# Patient Record
Sex: Female | Born: 1965 | Race: White | Hispanic: No | Smoking: Never smoker
Health system: Southern US, Community
[De-identification: ages and names within clinical notes are randomized; demographics above are authoritative.]

## PROBLEM LIST (undated history)

## (undated) DIAGNOSIS — N6001 Solitary cyst of right breast: Secondary | ICD-10-CM

## (undated) DIAGNOSIS — N6002 Solitary cyst of left breast: Secondary | ICD-10-CM

## (undated) DIAGNOSIS — J339 Nasal polyp, unspecified: Secondary | ICD-10-CM

## (undated) DIAGNOSIS — K2970 Gastritis, unspecified, without bleeding: Secondary | ICD-10-CM

## (undated) DIAGNOSIS — E669 Obesity, unspecified: Secondary | ICD-10-CM

## (undated) DIAGNOSIS — K219 Gastro-esophageal reflux disease without esophagitis: Secondary | ICD-10-CM

## (undated) DIAGNOSIS — A549 Gonococcal infection, unspecified: Secondary | ICD-10-CM

## (undated) DIAGNOSIS — A048 Other specified bacterial intestinal infections: Secondary | ICD-10-CM

## (undated) DIAGNOSIS — J45909 Unspecified asthma, uncomplicated: Secondary | ICD-10-CM

## (undated) DIAGNOSIS — D649 Anemia, unspecified: Secondary | ICD-10-CM

## (undated) DIAGNOSIS — J3489 Other specified disorders of nose and nasal sinuses: Secondary | ICD-10-CM

## (undated) DIAGNOSIS — R519 Headache, unspecified: Secondary | ICD-10-CM

## (undated) DIAGNOSIS — Z8742 Personal history of other diseases of the female genital tract: Secondary | ICD-10-CM

## (undated) DIAGNOSIS — M1990 Unspecified osteoarthritis, unspecified site: Secondary | ICD-10-CM

## (undated) DIAGNOSIS — F419 Anxiety disorder, unspecified: Secondary | ICD-10-CM

## (undated) DIAGNOSIS — T7840XA Allergy, unspecified, initial encounter: Secondary | ICD-10-CM

## (undated) DIAGNOSIS — I10 Essential (primary) hypertension: Secondary | ICD-10-CM

## (undated) DIAGNOSIS — A749 Chlamydial infection, unspecified: Secondary | ICD-10-CM

## (undated) DIAGNOSIS — R51 Headache: Secondary | ICD-10-CM

## (undated) DIAGNOSIS — R32 Unspecified urinary incontinence: Secondary | ICD-10-CM

## (undated) DIAGNOSIS — Z8041 Family history of malignant neoplasm of ovary: Secondary | ICD-10-CM

## (undated) HISTORY — DX: Family history of malignant neoplasm of ovary: Z80.41

## (undated) HISTORY — DX: Personal history of other diseases of the female genital tract: Z87.42

## (undated) HISTORY — DX: Essential (primary) hypertension: I10

## (undated) HISTORY — DX: Anemia, unspecified: D64.9

## (undated) HISTORY — DX: Gastritis, unspecified, without bleeding: K29.70

## (undated) HISTORY — DX: Nasal polyp, unspecified: J33.9

## (undated) HISTORY — PX: NASAL POLYP SURGERY: SHX186

## (undated) HISTORY — PX: TONSILLECTOMY: SUR1361

## (undated) HISTORY — DX: Unspecified urinary incontinence: R32

## (undated) HISTORY — DX: Allergy, unspecified, initial encounter: T78.40XA

## (undated) HISTORY — DX: Obesity, unspecified: E66.9

## (undated) HISTORY — DX: Solitary cyst of left breast: N60.02

## (undated) HISTORY — DX: Solitary cyst of left breast: N60.01

## (undated) HISTORY — DX: Other specified disorders of nose and nasal sinuses: J34.89

---

## 1998-11-12 HISTORY — PX: TUBAL LIGATION: SHX77

## 2006-08-27 ENCOUNTER — Ambulatory Visit: Payer: Self-pay

## 2006-08-29 ENCOUNTER — Ambulatory Visit: Payer: Self-pay

## 2006-11-12 HISTORY — PX: CHOLECYSTECTOMY: SHX55

## 2007-03-03 ENCOUNTER — Ambulatory Visit: Payer: Self-pay

## 2007-08-01 ENCOUNTER — Ambulatory Visit: Payer: Self-pay | Admitting: Gastroenterology

## 2007-09-01 ENCOUNTER — Ambulatory Visit: Payer: Self-pay

## 2007-09-01 ENCOUNTER — Ambulatory Visit: Payer: Self-pay | Admitting: Surgery

## 2007-09-01 ENCOUNTER — Other Ambulatory Visit: Payer: Self-pay

## 2009-02-07 ENCOUNTER — Ambulatory Visit: Payer: Self-pay | Admitting: Family Medicine

## 2009-09-09 ENCOUNTER — Ambulatory Visit: Payer: Self-pay

## 2009-12-23 ENCOUNTER — Ambulatory Visit: Payer: Self-pay | Admitting: Surgery

## 2009-12-28 ENCOUNTER — Ambulatory Visit: Payer: Self-pay | Admitting: Surgery

## 2010-10-16 ENCOUNTER — Ambulatory Visit: Payer: Self-pay

## 2010-11-12 HISTORY — PX: HERNIA REPAIR: SHX51

## 2010-12-25 HISTORY — PX: ENDOMETRIAL BIOPSY: SHX622

## 2011-11-13 DIAGNOSIS — Z8742 Personal history of other diseases of the female genital tract: Secondary | ICD-10-CM

## 2011-11-13 HISTORY — DX: Personal history of other diseases of the female genital tract: Z87.42

## 2011-12-31 ENCOUNTER — Ambulatory Visit: Payer: Self-pay

## 2012-01-03 ENCOUNTER — Ambulatory Visit: Payer: Self-pay

## 2013-01-21 ENCOUNTER — Ambulatory Visit: Payer: Self-pay

## 2013-01-29 DIAGNOSIS — R339 Retention of urine, unspecified: Secondary | ICD-10-CM | POA: Insufficient documentation

## 2013-01-29 DIAGNOSIS — R399 Unspecified symptoms and signs involving the genitourinary system: Secondary | ICD-10-CM | POA: Insufficient documentation

## 2013-01-29 DIAGNOSIS — N23 Unspecified renal colic: Secondary | ICD-10-CM | POA: Insufficient documentation

## 2013-01-29 DIAGNOSIS — N393 Stress incontinence (female) (male): Secondary | ICD-10-CM | POA: Insufficient documentation

## 2013-02-16 ENCOUNTER — Ambulatory Visit (INDEPENDENT_AMBULATORY_CARE_PROVIDER_SITE_OTHER): Payer: BC Managed Care – PPO | Admitting: General Surgery

## 2013-02-16 ENCOUNTER — Other Ambulatory Visit: Payer: Self-pay

## 2013-02-16 ENCOUNTER — Encounter: Payer: Self-pay | Admitting: General Surgery

## 2013-02-16 VITALS — BP 120/82 | HR 76 | Resp 14 | Ht 64.0 in | Wt 230.0 lb

## 2013-02-16 DIAGNOSIS — N63 Unspecified lump in unspecified breast: Secondary | ICD-10-CM

## 2013-02-16 DIAGNOSIS — N6001 Solitary cyst of right breast: Secondary | ICD-10-CM

## 2013-02-16 DIAGNOSIS — N6009 Solitary cyst of unspecified breast: Secondary | ICD-10-CM

## 2013-02-16 NOTE — Patient Instructions (Signed)
Six weeks

## 2013-02-16 NOTE — Progress Notes (Addendum)
Patient ID: Anita Clark, female   DOB: [birth year], 48 y.o.   MRN: [id number]  Chief Complaint  Patient presents with  . Mass    right breast    HPI Anita Clark is a 47 y.o. female who presents for a follow up mammogram 01/21/13 cat 3 . Patient has never had any breast problems in the past. Patient states she feels no lumps.  HPI  Past Medical History  Diagnosis Date  . Hypertension   . Anemia   . Allergy     Past Surgical History  Procedure Laterality Date  . Tubal ligation  2000  . Hernia repair  2012  . Cholecystectomy  2008    Family History  Problem Relation Age of Onset  . Hypertension Father   . Heart disease Father   . Heart disease Sister   . Hypertension Brother   . Hypertension Brother   . Hypertension Brother   . Ovarian cancer Maternal Grandmother     Social History History  Substance Use Topics  . Smoking status: Never Smoker   . Smokeless tobacco: Never Used  . Alcohol Use: No    No Known Allergies  Current Outpatient Prescriptions  Medication Sig Dispense Refill  . ALBUTEROL SULFATE HFA IN Inhale 2 puffs into the lungs as needed.      Marland Kitchen amLODipine (NORVASC) 10 MG tablet Take 10 mg by mouth daily.      Marland Kitchen atenolol (TENORMIN) 25 MG tablet Take 25 mg by mouth daily.      Marland Kitchen estradiol (ESTRACE) 1 MG tablet Take 1 mg by mouth daily.      Marland Kitchen imipramine (TOFRANIL) 25 MG tablet Take 25 tablets by mouth 2 (two) times daily with a meal.      . loratadine (ALLERGY) 10 MG tablet Take 10 mg by mouth daily.      . Omega-3 Fatty Acids (FISH OIL PO) Take 1 capsule by mouth daily.      . progesterone (PROMETRIUM) 200 MG capsule Take 200 mg by mouth daily.       No current facility-administered medications for this visit.    Review of Systems Review of Systems  Constitutional: Negative.   Respiratory: Negative.   Cardiovascular: Negative.     Blood pressure 120/82, pulse 76, resp. rate 14, height 5\' 4"  (1.626 m), weight 230 lb (104.327 kg), last  menstrual period 01/22/2013.  Physical Exam Physical Exam  Constitutional: She appears well-developed and well-nourished.  Neck: Normal range of motion. Neck supple.  Cardiovascular: Normal rate, regular rhythm and normal heart sounds.   Pulmonary/Chest: Effort normal and breath sounds normal. Right breast exhibits mass. Right breast exhibits no inverted nipple, no nipple discharge (1 o'clock 2cm nodular), no skin change and no tenderness. Left breast exhibits no inverted nipple, no mass, no nipple discharge, no skin change and no tenderness.    Lymphadenopathy:       Right axillary: No pectoral and no lateral adenopathy present.       Left axillary: No pectoral and no lateral adenopathy present.   Data Reviewed Bilateral mammograms dated January 09, 2013 reported heterogeneously dense breasts. Lobulated tissue in the right real area of the right breast was reported more prominent on previous studies. Additional views requested. BI-RAD-0.  Focal spot compression views did January 21, 2013 showed persistent density. Ultrasound showed a likely benign cyst but it did not meet all criteria and a 6 month followup was recommended. BI-RAD-3.  Ultrasound examination of the right breast  was completed. The dominant mass at the 1:00 position is a bilobed lesion measuring 1.34 x 1.41 x 1.55 cm. At the 6:00 position a 1.16 x 1.62 x 1.75 cm cyst is identified. At the 11:00 position a small hypoechoic likely cystic lesion measuring 0.49 x 0.67 x 0.68 cm is identified.  The patient was amenable to aspiration. 1 cc of 1% Xylocaine was utilized. The dominant lesions of the one 6:00 position were aspirated with near complete resolution.  Assessment    Multiple breast cysts.     Plan    The patient has been making use of hormonal therapy to control menorrhagia.  Discontinuation is not mandatory.  The patient will be asked to return in 6 weeks to reassess for early cyst recurrence.        [name] 02/17/2013, 9:18 PM

## 2013-02-17 ENCOUNTER — Encounter: Payer: Self-pay | Admitting: General Surgery

## 2013-02-17 DIAGNOSIS — N6009 Solitary cyst of unspecified breast: Secondary | ICD-10-CM | POA: Insufficient documentation

## 2013-03-30 ENCOUNTER — Ambulatory Visit: Payer: BC Managed Care – PPO | Admitting: General Surgery

## 2013-04-29 ENCOUNTER — Ambulatory Visit: Payer: Self-pay | Admitting: General Surgery

## 2013-05-27 ENCOUNTER — Ambulatory Visit (INDEPENDENT_AMBULATORY_CARE_PROVIDER_SITE_OTHER): Payer: BC Managed Care – PPO | Admitting: General Surgery

## 2013-05-27 ENCOUNTER — Encounter: Payer: Self-pay | Admitting: General Surgery

## 2013-05-27 ENCOUNTER — Other Ambulatory Visit: Payer: Self-pay

## 2013-05-27 VITALS — BP 140/84 | HR 92 | Resp 14 | Ht 64.0 in | Wt 229.0 lb

## 2013-05-27 DIAGNOSIS — N6009 Solitary cyst of unspecified breast: Secondary | ICD-10-CM

## 2013-05-27 DIAGNOSIS — N6001 Solitary cyst of right breast: Secondary | ICD-10-CM

## 2013-05-27 NOTE — Patient Instructions (Addendum)
Patient to return in Octl 2014 right breast mammogram.

## 2013-05-27 NOTE — Progress Notes (Signed)
Patient ID: Anita Clark, female   DOB: [birth year], 47 y.o.   MRN: [id number]  Chief Complaint  Patient presents with  . Follow-up    breast cyst    HPI Anita Clark is a 47 y.o. female here today following up from her 6 week right breast cyst. Patient thought she might have appreciated a knot in the right breast as of three days ago.  The patient is experiencing no breast pain or discomfort. She is accompanied today by her daughter, who is a CM A. In the pediatric ICU at [facility]. HPI  Past Medical History  Diagnosis Date  . Hypertension   . Anemia   . Allergy     Past Surgical History  Procedure Laterality Date  . Tubal ligation  2000  . Hernia repair  2012  . Cholecystectomy  2008    Family History  Problem Relation Age of Onset  . Hypertension Father   . Heart disease Father   . Heart disease Sister   . Hypertension Brother   . Hypertension Brother   . Hypertension Brother   . Ovarian cancer Maternal Grandmother     Social History History  Substance Use Topics  . Smoking status: Never Smoker   . Smokeless tobacco: Never Used  . Alcohol Use: No    No Known Allergies  Current Outpatient Prescriptions  Medication Sig Dispense Refill  . ALBUTEROL SULFATE HFA IN Inhale 2 puffs into the lungs as needed.      Marland Kitchen amLODipine (NORVASC) 10 MG tablet Take 10 mg by mouth daily.      Marland Kitchen atenolol (TENORMIN) 25 MG tablet Take 25 mg by mouth daily.      Marland Kitchen estradiol (ESTRACE) 1 MG tablet Take 1 mg by mouth daily.      Marland Kitchen imipramine (TOFRANIL) 25 MG tablet Take 25 tablets by mouth 2 (two) times daily with a meal.      . loratadine (ALLERGY) 10 MG tablet Take 10 mg by mouth daily.      . Omega-3 Fatty Acids (FISH OIL PO) Take 1 capsule by mouth daily.      . progesterone (PROMETRIUM) 200 MG capsule Take 200 mg by mouth daily.       No current facility-administered medications for this visit.    Review of Systems Review of Systems  Constitutional: Negative.    Respiratory: Negative.   Cardiovascular: Negative.     Blood pressure 140/84, pulse 92, resp. rate 14, height 5\' 4"  (1.626 m), weight 229 lb (103.874 kg).  Physical Exam Physical Exam  Constitutional: She is oriented to person, place, and time. She appears well-developed and well-nourished.  Cardiovascular: Normal rate, regular rhythm and normal heart sounds.   Pulmonary/Chest: Breath sounds normal. Right breast exhibits no inverted nipple, no mass, no nipple discharge, no skin change and no tenderness. Left breast exhibits no inverted nipple, no mass, no nipple discharge, no skin change and no tenderness.  Lymphadenopathy:    She has no cervical adenopathy.    She has no axillary adenopathy.  Neurological: She is alert and oriented to person, place, and time.  Skin: Skin is warm and dry.    Data Reviewed Ultrasound examination of the right breast and 1:00 positions does not show recurrence of the previously aspirated cyst. At the 12:00 position 3 cm from the nipple a poorly defined 0.54 x 0.58 x 0.58 hypoechoic area with a central area of hyper echoic tissue with acoustic shadowing on one view was  appreciated. Multiple cysts are appreciated the retroareolar area. The previously aspirated cyst at the 6:00 position 3 cm level shows partial recurrence with a maximum diameter of 0.5 cm.  Assessment    Multiple breast cysts.  Ongoing HRT for vasomotor symptoms.     Plan    We will followup her breast with a right diagnostic mammogram in October as originally recommended by the radiologist. Anita Clark repeat the ultrasound at that time to reassess the hypoechoic area in the 12:00 position. This may be a degenerating cyst, but it is indeterminate lesion.  The patient should plan on discussing with her GYN provider the role of ongoing HRT therapy. She's been using it for 2 years now, and at maybe time per trial off medication to see if his decreases cyst formation without a return of vasomotor  symptoms.        Anita Clark 05/27/2013, 8:30 PM

## 2013-08-13 ENCOUNTER — Ambulatory Visit: Payer: Self-pay | Admitting: General Surgery

## 2013-08-13 ENCOUNTER — Encounter: Payer: Self-pay | Admitting: General Surgery

## 2013-09-02 ENCOUNTER — Ambulatory Visit (INDEPENDENT_AMBULATORY_CARE_PROVIDER_SITE_OTHER): Payer: BC Managed Care – PPO | Admitting: General Surgery

## 2013-09-02 ENCOUNTER — Encounter: Payer: Self-pay | Admitting: General Surgery

## 2013-09-02 ENCOUNTER — Other Ambulatory Visit: Payer: BC Managed Care – PPO

## 2013-09-02 VITALS — BP 120/78 | HR 80 | Resp 14 | Ht 64.0 in | Wt 229.0 lb

## 2013-09-02 DIAGNOSIS — N63 Unspecified lump in unspecified breast: Secondary | ICD-10-CM

## 2013-09-02 NOTE — Progress Notes (Signed)
Patient ID: Anita Clark, female   DOB: Nov 01, 1966, 47 y.o.   MRN: [id number]  Chief Complaint  Patient presents with  . Follow-up    mammogram    HPI Anita Clark is a 47 y.o. female who presents for a breast evaluation. The most recent right breast  mammogram was done on 08/13/13 at [facility]. Patient does perform regular self breast checks and gets regular mammograms done. The patient states no new problems with her breasts at this time.   HPI  Past Medical History  Diagnosis Date  . Hypertension   . Anemia   . Allergy     Past Surgical History  Procedure Laterality Date  . Tubal ligation  2000  . Hernia repair  2012  . Cholecystectomy  2008    Family History  Problem Relation Age of Onset  . Hypertension Father   . Heart disease Father   . Heart disease Sister   . Hypertension Brother   . Hypertension Brother   . Hypertension Brother   . Ovarian cancer Maternal Grandmother     Social History History  Substance Use Topics  . Smoking status: Never Smoker   . Smokeless tobacco: Never Used  . Alcohol Use: No    No Known Allergies  Current Outpatient Prescriptions  Medication Sig Dispense Refill  . ALBUTEROL SULFATE HFA IN Inhale 2 puffs into the lungs as needed.      Marland Kitchen amLODipine (NORVASC) 10 MG tablet Take 10 mg by mouth daily.      Marland Kitchen atenolol (TENORMIN) 25 MG tablet Take 25 mg by mouth daily.      Marland Kitchen estradiol (ESTRACE) 1 MG tablet Take 1 mg by mouth daily.      Marland Kitchen imipramine (TOFRANIL) 25 MG tablet Take 25 tablets by mouth 2 (two) times daily with a meal.      . loratadine (ALLERGY) 10 MG tablet Take 10 mg by mouth daily.      . Omega-3 Fatty Acids (FISH OIL PO) Take 1 capsule by mouth daily.      . progesterone (PROMETRIUM) 200 MG capsule Take 200 mg by mouth daily.       No current facility-administered medications for this visit.    Review of Systems Review of Systems  Constitutional: Negative.   Respiratory: Negative.   Cardiovascular: Negative.      Blood pressure 120/78, pulse 80, resp. rate 14, height 5\' 4"  (1.626 m), weight 229 lb (103.874 kg), last menstrual period 07/20/2013.  Physical Exam Physical Exam  Constitutional: She is oriented to person, place, and time. She appears well-developed and well-nourished.  Neck: No thyromegaly present.  Cardiovascular: Normal rate and normal heart sounds.   No murmur heard. Pulmonary/Chest: Effort normal and breath sounds normal. Right breast exhibits no inverted nipple, no mass, no nipple discharge, no skin change and no tenderness. Left breast exhibits no inverted nipple, no mass, no nipple discharge, no skin change and no tenderness.  Lymphadenopathy:    She has no cervical adenopathy.    She has no axillary adenopathy.  Neurological: She is alert and oriented to person, place, and time.  Skin: Skin is warm and dry.    Data Reviewed Right breast mammogram dated August 13, 2013 showed circumscribed nodular densities in the upper central medial periareolar right breast decreased in size since previous exam of January 21, 2013. BI-RAD-2.  Ultrasound examination of the right breast in the 12:00 position showed an ill-defined heterogeneous area measuring 0.47 x 0.57 x 0.68  cm. Multiple simple cysts were noted adjacent to this. The patient was amenable to FNA sampling.  Using 1 cc of 1% plain Xylocaine  A 22-gauge needle was used to puncture the area with multiple passes. Decrease in volume noted. Slides x4 repair for cytology.  Assessment    Breast cysts, likely aggravated by ongoing estrogen use.     Plan    The patient is presently asymptomatic. Further followup will take place based on the cytology results obtained today.        [name] 09/02/2013, 9:17 PM

## 2013-09-05 LAB — FINE-NEEDLE ASPIRATION

## 2013-09-08 ENCOUNTER — Telehealth: Payer: Self-pay | Admitting: *Deleted

## 2013-09-08 NOTE — Telephone Encounter (Signed)
Notified patient as instructed, patient pleased. Discussed follow-up appointments, patient agrees. Placed in recalls.  

## 2013-09-08 NOTE — Telephone Encounter (Signed)
Message copied by Currie Paris on Tue Sep 08, 2013  8:25 AM ------      Message from: [name]      Created: Tue Sep 08, 2013  7:49 AM       Notify the patient the cytology was fine. I would like to check her in six months (OV w/ office u/s). Thanks. ------

## 2013-09-17 ENCOUNTER — Other Ambulatory Visit: Payer: Self-pay

## 2013-11-12 HISTORY — PX: BREAST CYST ASPIRATION: SHX578

## 2014-01-20 ENCOUNTER — Ambulatory Visit: Payer: Self-pay

## 2014-03-08 ENCOUNTER — Other Ambulatory Visit: Payer: BC Managed Care – PPO

## 2014-03-08 ENCOUNTER — Encounter: Payer: Self-pay | Admitting: General Surgery

## 2014-03-08 ENCOUNTER — Ambulatory Visit (INDEPENDENT_AMBULATORY_CARE_PROVIDER_SITE_OTHER): Payer: BC Managed Care – PPO | Admitting: General Surgery

## 2014-03-08 VITALS — BP 150/82 | HR 76 | Resp 12 | Ht 64.0 in | Wt 227.0 lb

## 2014-03-08 DIAGNOSIS — N63 Unspecified lump in unspecified breast: Secondary | ICD-10-CM

## 2014-03-08 NOTE — Patient Instructions (Signed)
Continue self breast exams. Call office for any new breast issues or concerns. Patient to return as needed.   

## 2014-03-08 NOTE — Progress Notes (Signed)
Patient ID: Anita Clark, female   DOB: 05/10/1966, 48 y.o.   MRN: [id number]  Chief Complaint  Patient presents with  . Follow-up    ultrasound    HPI Anita Clark is a 48 y.o. female here today for a right breast ultrasound. The patient denies any new problems with her breasts at this time.   HPI  Past Medical History  Diagnosis Date  . Hypertension   . Anemia   . Allergy     Past Surgical History  Procedure Laterality Date  . Tubal ligation  2000  . Hernia repair  2012  . Cholecystectomy  2008    Family History  Problem Relation Age of Onset  . Hypertension Father   . Heart disease Father   . Heart disease Sister   . Hypertension Brother   . Hypertension Brother   . Hypertension Brother   . Ovarian cancer Maternal Grandmother     Social History History  Substance Use Topics  . Smoking status: Never Smoker   . Smokeless tobacco: Never Used  . Alcohol Use: No    No Known Allergies  Current Outpatient Prescriptions  Medication Sig Dispense Refill  . ALBUTEROL SULFATE HFA IN Inhale 2 puffs into the lungs as needed.      Marland Kitchen. amLODipine (NORVASC) 10 MG tablet Take 10 mg by mouth daily.      Marland Kitchen. atenolol (TENORMIN) 25 MG tablet Take 25 mg by mouth daily.      Marland Kitchen. imipramine (TOFRANIL) 25 MG tablet Take 25 tablets by mouth 2 (two) times daily with a meal.      . loratadine (ALLERGY) 10 MG tablet Take 10 mg by mouth daily.      . Omega-3 Fatty Acids (FISH OIL PO) Take 1 capsule by mouth daily.       No current facility-administered medications for this visit.    Review of Systems Review of Systems  Constitutional: Negative.   Respiratory: Negative.   Cardiovascular: Negative.     Blood pressure 150/82, pulse 76, resp. rate 12, height 5\' 4"  (1.626 m), weight 227 lb (102.967 kg), last menstrual period 01/22/2014.  Physical Exam Physical Exam  Constitutional: She is oriented to person, place, and time. She appears well-developed and well-nourished.  Neck:  Neck supple. No thyromegaly present.  Cardiovascular: Normal rate, regular rhythm and normal heart sounds.   No murmur heard. Pulmonary/Chest: Effort normal and breath sounds normal. Right breast exhibits no inverted nipple, no mass, no nipple discharge, no skin change and no tenderness. Left breast exhibits no inverted nipple, no mass, no nipple discharge, no skin change and no tenderness.  Right breast 1/2 cup size bigger than left breast.     Lymphadenopathy:    She has no cervical adenopathy.    She has no axillary adenopathy.  Neurological: She is alert and oriented to person, place, and time.  Skin: Skin is warm and dry.    Data Reviewed Ultrasound examination of the right breast showed previously identified cysts. No solid lesion corresponding to that noted on her last exam. No images, no charge.  Cytology completed September 02, 2013 showed blood and adipose tissue. No epithelial elements. As the area decreased markedly in size this was likely a complex cyst.  Assessment    Benign breast exam.     Plan    The patient should continue annual breast exams and screening mammograms with her primary care provider. Should any clinical changes be identified she is well or  return at any time.      PCP: Harlin Rainhies, David   Judy Goodenow W Korver Graybeal 03/08/2014, 9:37 PM

## 2014-09-13 ENCOUNTER — Encounter: Payer: Self-pay | Admitting: General Surgery

## 2015-01-25 ENCOUNTER — Ambulatory Visit: Payer: Self-pay

## 2015-11-13 DIAGNOSIS — K2970 Gastritis, unspecified, without bleeding: Secondary | ICD-10-CM

## 2015-11-13 HISTORY — PX: ESOPHAGOGASTRODUODENOSCOPY: SHX1529

## 2015-11-13 HISTORY — DX: Gastritis, unspecified, without bleeding: K29.70

## 2015-11-13 HISTORY — PX: COLONOSCOPY: SHX174

## 2015-11-22 ENCOUNTER — Other Ambulatory Visit: Payer: Self-pay | Admitting: Certified Nurse Midwife

## 2015-11-22 DIAGNOSIS — Z1231 Encounter for screening mammogram for malignant neoplasm of breast: Secondary | ICD-10-CM

## 2015-12-07 DIAGNOSIS — N3941 Urge incontinence: Secondary | ICD-10-CM | POA: Insufficient documentation

## 2016-01-13 ENCOUNTER — Encounter: Admission: RE | Payer: Self-pay | Source: Ambulatory Visit

## 2016-01-13 ENCOUNTER — Ambulatory Visit: Admission: RE | Admit: 2016-01-13 | Payer: Self-pay | Source: Ambulatory Visit | Admitting: Gastroenterology

## 2016-01-13 SURGERY — COLONOSCOPY WITH PROPOFOL
Anesthesia: General

## 2016-01-26 ENCOUNTER — Ambulatory Visit
Admission: RE | Admit: 2016-01-26 | Discharge: 2016-01-26 | Disposition: A | Discharge status: Home / Self Care | Payer: BC Managed Care – PPO | Source: Ambulatory Visit | Attending: Certified Nurse Midwife | Admitting: Certified Nurse Midwife

## 2016-01-26 DIAGNOSIS — Z1231 Encounter for screening mammogram for malignant neoplasm of breast: Secondary | ICD-10-CM | POA: Diagnosis present

## 2016-02-23 IMAGING — MG MM DIGITAL SCREENING BILAT W/ CAD
1 series · 5 of 5 positions shown · non-contrast
Comparison: Previous exam(s).

CLINICAL DATA: Screening.

EXAM:
DIGITAL SCREENING BILATERAL MAMMOGRAM WITH CAD

[R CC · right · 5 of 5 slices shown]
[im 1/5]
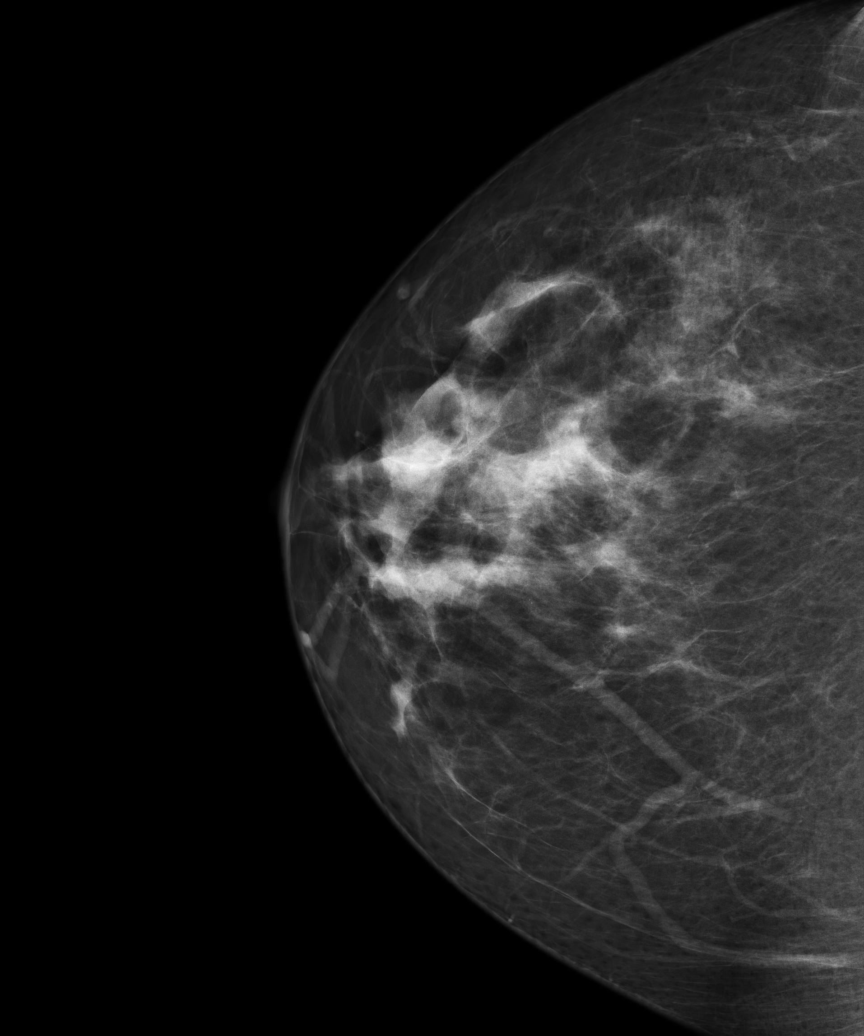
[im 2/5]
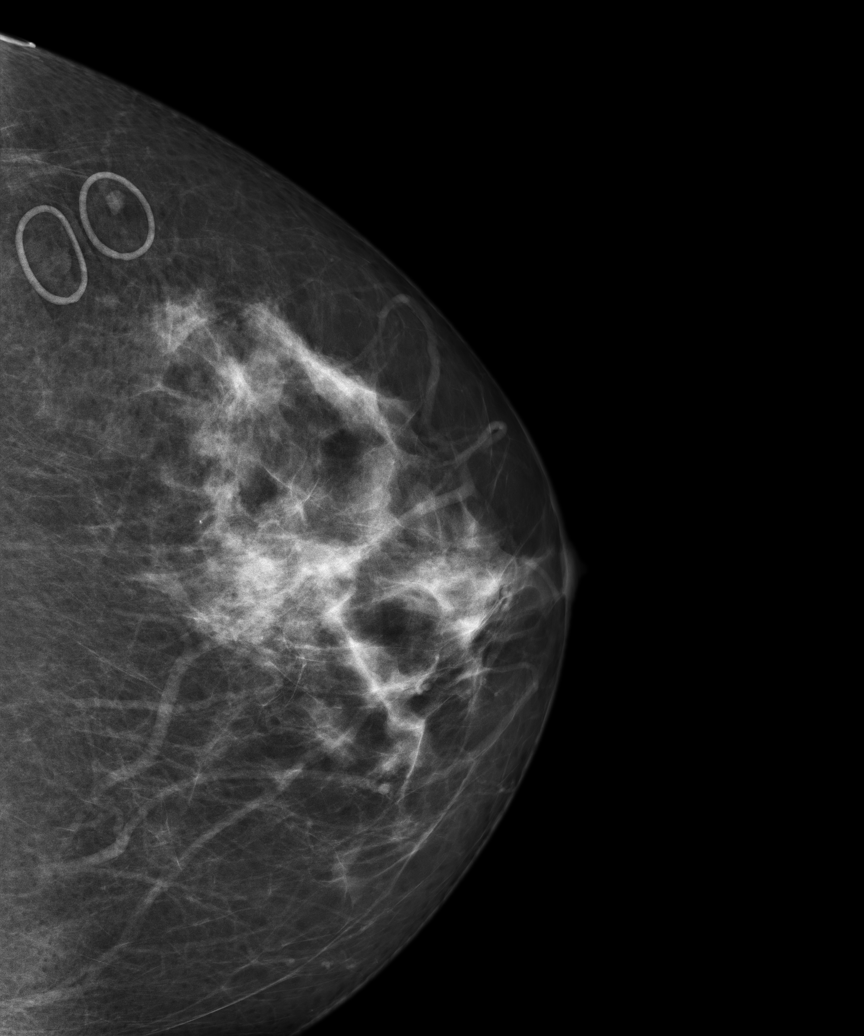
[im 3/5]
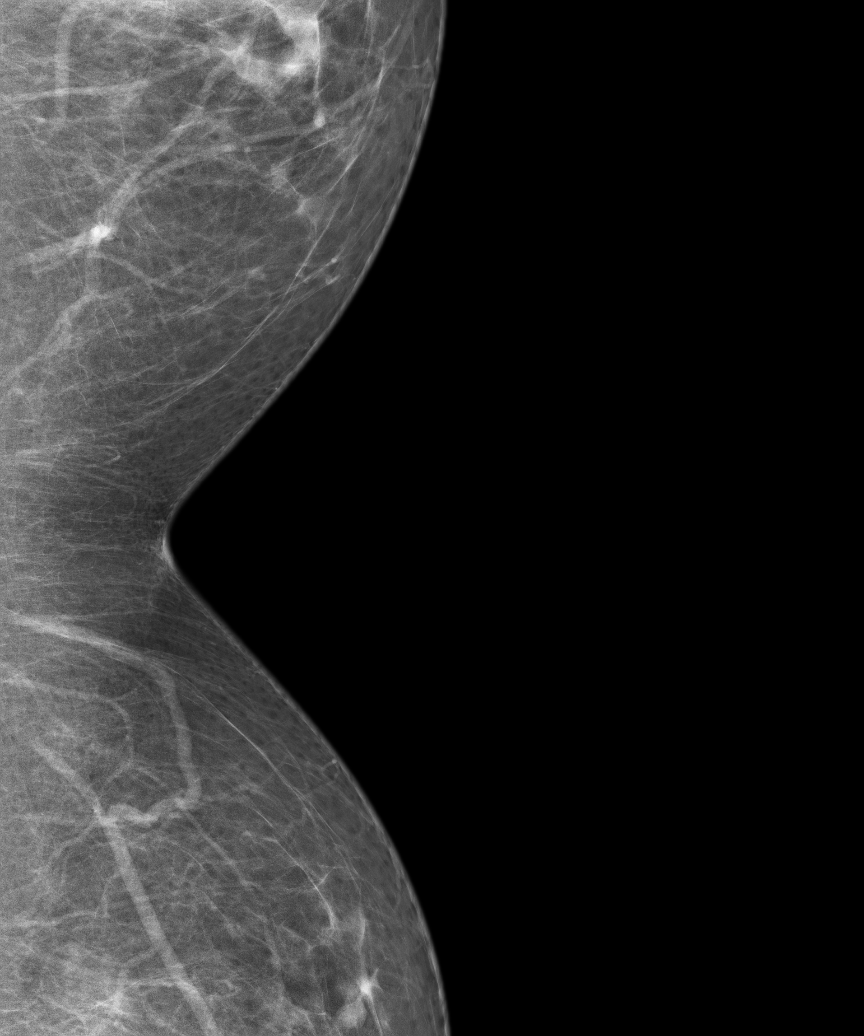
[im 4/5]
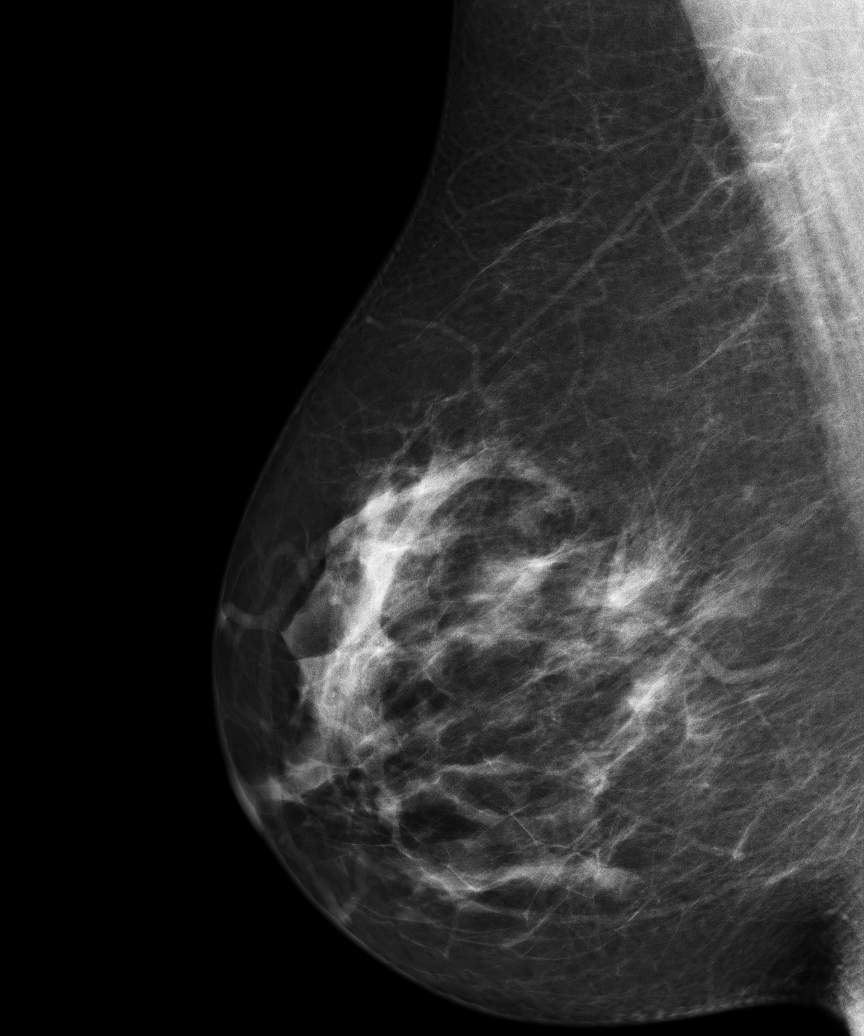
[im 5/5]
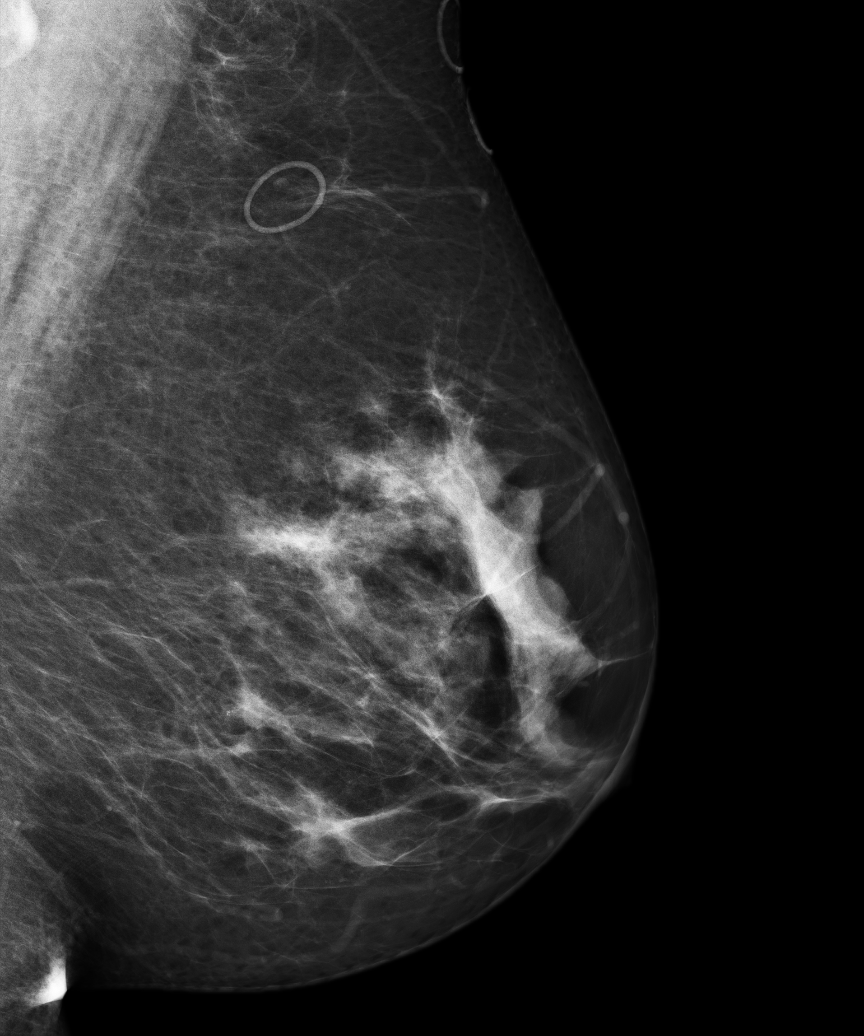

[5 of 5 positions shown; findings below may reference images not displayed]

ACR Breast Density Category c: The breast tissue is heterogeneously
dense, which may obscure small masses.
FINDINGS: There are no findings suspicious for malignancy. Images were
processed with CAD.
IMPRESSION: No mammographic evidence of malignancy. A result letter of this
screening mammogram will be mailed directly to the patient.

RECOMMENDATION:
Screening mammogram in one year. (Code:YJ-2-FEZ)

BI-RADS CATEGORY  1: Negative.

## 2016-06-28 ENCOUNTER — Encounter: Payer: Self-pay | Admitting: General Surgery

## 2016-06-28 ENCOUNTER — Ambulatory Visit (INDEPENDENT_AMBULATORY_CARE_PROVIDER_SITE_OTHER): Payer: BC Managed Care – PPO | Admitting: General Surgery

## 2016-06-28 ENCOUNTER — Other Ambulatory Visit: Payer: Self-pay

## 2016-06-28 VITALS — BP 146/82 | HR 84 | Resp 14 | Ht 64.0 in | Wt 256.0 lb

## 2016-06-28 DIAGNOSIS — N6310 Unspecified lump in the right breast, unspecified quadrant: Secondary | ICD-10-CM

## 2016-06-28 DIAGNOSIS — N63 Unspecified lump in breast: Secondary | ICD-10-CM | POA: Diagnosis not present

## 2016-06-28 DIAGNOSIS — N6001 Solitary cyst of right breast: Secondary | ICD-10-CM | POA: Diagnosis not present

## 2016-06-28 HISTORY — PX: BREAST CYST ASPIRATION: SHX578

## 2016-06-28 NOTE — Patient Instructions (Signed)
The patient is aware to call back for any questions or concerns.  

## 2016-06-28 NOTE — Progress Notes (Addendum)
Patient ID: Anita Clark, female   DOB: 09/06/1966, 50 y.o.   MRN: [id number]  Chief Complaint  Patient presents with  . Breast Problem    knot    HPI Anita Clark is a 50 y.o. female.  Here today for a right breast knot. She noticed this area because the breast was sore. She states she noticed this area about 2-3 weeks ago. It has not changed in size and it just below her nipple. Denies any breast injury or trauma. Last mammogram was March.   HPI  Past Medical History:  Diagnosis Date  . Allergy   . Anemia   . Hypertension     Past Surgical History:  Procedure Laterality Date  . BREAST CYST ASPIRATION Right   . CHOLECYSTECTOMY  2008  . HERNIA REPAIR  2012  . TUBAL LIGATION  2000    Family History  Problem Relation Age of Onset  . Hypertension Father   . Heart disease Father   . Heart disease Sister   . Hypertension Brother   . Hypertension Brother   . Hypertension Brother   . Ovarian cancer Maternal Grandmother     Social History Social History  Substance Use Topics  . Smoking status: Never Smoker  . Smokeless tobacco: Never Used  . Alcohol use No    No Known Allergies  Current Outpatient Prescriptions  Medication Sig Dispense Refill  . ALBUTEROL SULFATE HFA IN Inhale 2 puffs into the lungs as needed.    Marland Kitchen. amLODipine-benazepril (LOTREL) 5-20 MG capsule Take 1 capsule by mouth daily.     . fexofenadine (ALLEGRA) 180 MG tablet Take 180 mg by mouth daily.    Marland Kitchen. imipramine (TOFRANIL) 25 MG tablet Take 25 tablets by mouth 2 (two) times daily with a meal.    . metoprolol succinate (TOPROL-XL) 50 MG 24 hr tablet Take by mouth.     No current facility-administered medications for this visit.     Review of Systems Review of Systems  Constitutional: Negative.   Respiratory: Negative.   Cardiovascular: Negative.     Blood pressure (!) 146/82, pulse 84, resp. rate 14, height 5\' 4"  (1.626 m), weight 256 lb (116.1 kg), last menstrual period  01/22/2014.  Physical Exam Physical Exam  Constitutional: She is oriented to person, place, and time. She appears well-developed and well-nourished.  HENT:  Mouth/Throat: Oropharynx is clear and moist.  Eyes: Conjunctivae are normal. No scleral icterus.  Neck: Neck supple.  Cardiovascular: Normal rate, regular rhythm and normal heart sounds.   Pulmonary/Chest: Effort normal and breath sounds normal. Right breast exhibits mass. Right breast exhibits no inverted nipple, no nipple discharge, no skin change and no tenderness. Left breast exhibits no inverted nipple, no mass, no nipple discharge, no skin change and no tenderness.    Lymphadenopathy:    She has no cervical adenopathy.  Neurological: She is alert and oriented to person, place, and time.  Skin: Skin is warm and dry.  Psychiatric: Her behavior is normal.    Data Reviewed 01/26/2016 mammogram was reviewed. I read-1.  Smoothly marginated nodules more prominent on cc view than last year.  Area of the patient's present concern corresponds to one of the smoothly marginated nodules noted on her March 2017 mammogram.  Ultrasound examination of the right breast was undertaken to assess the palpable mass present on clinical exam.  At the 5:00 position 2 cm from the nipple a thick-walled lesion with strong posterior acoustic enhancement and irregular lumen was identified.  This measured 1.2 x 1.6 x 1.76 cm. The images with the hyperechoic rim suggested an inflamed cyst. BI-RADS-3.  At the 10:00 position, 2 cm from nipple a 1.0 x 1.3 x 1.5 cm simple cyst is identified. Smooth borders, strong posterior acoustic enhancement.  At the 9:00 position multiple cystic lesions were identified measuring up to 0.85 cm in diameter.  While the patient reported significant symptomatic improvement in the discomfort in the index lesion at the 5:00 position she desired aspiration, which I think is warranted based on ultrasound appearance.  1 mL of 1%  plain Xylocaine was utilized and alcohol for skin prep. A 22-gauge needle was used to aspirate the lesion with near complete resolution, although the thickened wall remained. Approximately 1.5 mL of turbid based fluid was obtained, mixed with an equal volume of cytology fixative and sent for cytologic review. The procedure was well tolerated.  Assessment    Likely inflamed cyst involving the right breast parenchyma, resolved on aspiration.    Plan    Follow-up will be determined based on cytology results.      This information has been scribed by Dorathy DaftMarsha Hatch RN, BSN,BC.  Earline MayotteByrnett, Artha Stavros W 06/29/2016, 9:02 PM

## 2016-07-02 ENCOUNTER — Telehealth: Payer: Self-pay | Admitting: General Surgery

## 2016-07-02 LAB — FINE-NEEDLE ASPIRATION

## 2016-07-03 NOTE — Telephone Encounter (Signed)
Cytology benign. We'll arrange for a 6 month follow-up

## 2016-08-07 ENCOUNTER — Ambulatory Visit: Payer: BC Managed Care – PPO | Admitting: General Surgery

## 2016-08-23 ENCOUNTER — Encounter: Payer: Self-pay | Admitting: General Surgery

## 2016-08-23 ENCOUNTER — Ambulatory Visit (INDEPENDENT_AMBULATORY_CARE_PROVIDER_SITE_OTHER): Payer: BC Managed Care – PPO | Admitting: General Surgery

## 2016-08-23 VITALS — BP 148/82 | HR 74 | Resp 12 | Ht 64.0 in | Wt 253.0 lb

## 2016-08-23 DIAGNOSIS — N6001 Solitary cyst of right breast: Secondary | ICD-10-CM

## 2016-08-23 NOTE — Patient Instructions (Signed)
Return as needed

## 2016-08-23 NOTE — Progress Notes (Signed)
Patient ID: Anita Clark, female   DOB: 04/20/1966, 50 y.o.   MRN: [id number]  Chief Complaint  Patient presents with  . Follow-up    breast cyst    HPI Anita Lorylicia Dawn Hitsman is a 50 y.o. female here today for her follow up right breast cyst . Patient states  She doesn't feel any thing in her right breast. Daughter Lelon MastSamantha was present. No breast tenderness since 10-14 days after original cyst aspiration. HPI  Past Medical History:  Diagnosis Date  . Allergy   . Anemia   . Hypertension     Past Surgical History:  Procedure Laterality Date  . BREAST CYST ASPIRATION Right   . CHOLECYSTECTOMY  2008  . HERNIA REPAIR  2012  . TUBAL LIGATION  2000    Family History  Problem Relation Age of Onset  . Hypertension Father   . Heart disease Father   . Heart disease Sister   . Hypertension Brother   . Hypertension Brother   . Hypertension Brother   . Ovarian cancer Maternal Grandmother     Social History Social History  Substance Use Topics  . Smoking status: Never Smoker  . Smokeless tobacco: Never Used  . Alcohol use No    No Known Allergies  Current Outpatient Prescriptions  Medication Sig Dispense Refill  . ALBUTEROL SULFATE HFA IN Inhale 2 puffs into the lungs as needed.    Marland Kitchen. amLODipine-benazepril (LOTREL) 5-20 MG capsule Take 1 capsule by mouth daily.     . fexofenadine (ALLEGRA) 180 MG tablet Take 180 mg by mouth daily.    Marland Kitchen. imipramine (TOFRANIL) 25 MG tablet Take 25 tablets by mouth 2 (two) times daily with a meal.    . metoprolol succinate (TOPROL-XL) 50 MG 24 hr tablet Take by mouth.     No current facility-administered medications for this visit.     Review of Systems Review of Systems  Constitutional: Negative.   Respiratory: Negative.   Cardiovascular: Negative.     Blood pressure (!) 148/82, pulse 74, resp. rate 12, height 5\' 4"  (1.626 m), weight 253 lb (114.8 kg), last menstrual period 01/22/2014.  Physical Exam Physical Exam  Constitutional:  She is oriented to person, place, and time. She appears well-developed and well-nourished.  Eyes: Conjunctivae are normal. No scleral icterus.  Neck: Neck supple.  Cardiovascular: Normal rate, regular rhythm and normal heart sounds.   Pulmonary/Chest: Effort normal and breath sounds normal. Right breast exhibits no inverted nipple, no mass, no nipple discharge, no skin change and no tenderness. Left breast exhibits no inverted nipple, no mass, no nipple discharge, no skin change and no tenderness.    Lymphadenopathy:    She has no cervical adenopathy.    She has no axillary adenopathy.  Neurological: She is alert and oriented to person, place, and time.  Skin: Skin is warm and dry.    Data Reviewed 08/28/2016 FNA of a suspected inflamed cyst DIAGNOSIS: Comment   Comments: RIGHT BREAST  NEGATIVE FOR MALIGNANT CELLS.  FOAM CELLS ARE PRESENT.  PROTEINACEOUS MATERIAL IS PRESENT.  CYTOLOGIC FINDINGS SHOULD BE EVALUATED IN CONJUNCTION WITH PHYSICAL AND  MAMMOGRAPHIC FINDINGS.      Assessment    Resolution of previously noted breast mass.    Plan    Patient was encouraged to continue monthly self examination and annual screening mammograms with her GYN provider.   Patient to return as needed.  This information has been scribed by Ples SpecterJessica Qualls CMA.    Earline MayotteByrnett, Hersel Mcmeen W  08/23/2016, 8:34 PM

## 2016-09-14 ENCOUNTER — Other Ambulatory Visit: Payer: Self-pay | Admitting: Orthopedic Surgery

## 2016-09-14 DIAGNOSIS — M25562 Pain in left knee: Principal | ICD-10-CM

## 2016-09-14 DIAGNOSIS — G8929 Other chronic pain: Secondary | ICD-10-CM

## 2016-09-27 ENCOUNTER — Ambulatory Visit: Admission: RE | Admit: 2016-09-27 | Payer: BC Managed Care – PPO | Source: Ambulatory Visit

## 2016-12-18 ENCOUNTER — Other Ambulatory Visit: Payer: Self-pay | Admitting: Certified Nurse Midwife

## 2016-12-18 DIAGNOSIS — Z1231 Encounter for screening mammogram for malignant neoplasm of breast: Secondary | ICD-10-CM

## 2017-01-25 DIAGNOSIS — R8281 Pyuria: Secondary | ICD-10-CM | POA: Insufficient documentation

## 2017-01-31 ENCOUNTER — Ambulatory Visit (INDEPENDENT_AMBULATORY_CARE_PROVIDER_SITE_OTHER): Payer: BC Managed Care – PPO | Admitting: Certified Nurse Midwife

## 2017-01-31 ENCOUNTER — Encounter: Payer: Self-pay | Admitting: Certified Nurse Midwife

## 2017-01-31 ENCOUNTER — Ambulatory Visit
Admission: RE | Admit: 2017-01-31 | Discharge: 2017-01-31 | Disposition: A | Discharge status: Home / Self Care | Payer: BC Managed Care – PPO | Source: Ambulatory Visit | Attending: Certified Nurse Midwife | Admitting: Certified Nurse Midwife

## 2017-01-31 VITALS — BP 120/80 | HR 93 | Ht 64.0 in | Wt 257.0 lb

## 2017-01-31 DIAGNOSIS — Z124 Encounter for screening for malignant neoplasm of cervix: Secondary | ICD-10-CM

## 2017-01-31 DIAGNOSIS — J45909 Unspecified asthma, uncomplicated: Secondary | ICD-10-CM | POA: Insufficient documentation

## 2017-01-31 DIAGNOSIS — Z01419 Encounter for gynecological examination (general) (routine) without abnormal findings: Secondary | ICD-10-CM

## 2017-01-31 DIAGNOSIS — K219 Gastro-esophageal reflux disease without esophagitis: Secondary | ICD-10-CM | POA: Insufficient documentation

## 2017-01-31 DIAGNOSIS — Z1231 Encounter for screening mammogram for malignant neoplasm of breast: Secondary | ICD-10-CM | POA: Diagnosis present

## 2017-01-31 DIAGNOSIS — E669 Obesity, unspecified: Secondary | ICD-10-CM

## 2017-01-31 DIAGNOSIS — L409 Psoriasis, unspecified: Secondary | ICD-10-CM | POA: Insufficient documentation

## 2017-01-31 DIAGNOSIS — I10 Essential (primary) hypertension: Secondary | ICD-10-CM | POA: Insufficient documentation

## 2017-01-31 DIAGNOSIS — Z8041 Family history of malignant neoplasm of ovary: Secondary | ICD-10-CM | POA: Insufficient documentation

## 2017-01-31 HISTORY — DX: Obesity, unspecified: E66.9

## 2017-01-31 NOTE — Progress Notes (Signed)
Gynecology Annual Exam  PCP: Mickey Farber, MD  Chief Complaint:  Chief Complaint  Patient presents with  . Gynecologic Exam    History of Present Illness: Patient is a 51 y.o. [id number] postmenopausal female who presents for annual exam. The patient has no significant gyn complaints today. Her hot flashes have decreased in intensity and frequency. She is no longer taking Estroven. She has not had any bleeding. She is taking imiprimine for urgency incontinence and is followed by Dr Achilles Dunk.  LMP: Patient's last menstrual period was 01/22/2014.  Since her last annual exam 01/27/2016 she has had a colonoscopy (2017) which was normal. She has also had an aspiration of a breast cyst in August 2017 by Dr Lemar Livings. 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 She was advised to lose weight and has made an appointment with her PCP to start on weight reduction program including weight reduction medications, exercise and diet. Her current BMI is 44. Her last pap smear was 01/27/2016 was NIL. Has a hx of AGUS PAp back in 2013 There is a family history of ovarian cancer in her MGM. The patient tested negative for HBOC. There is no family history of breast cancer. She does monthly self breast exams. Her last mammogram was today and results are pending. Previous mammogram 01/26/2016 was negative. She does not drink alcohol or use tobacco products She does get adequate calcium in her diet.  She recently had a lipid panel by PCP and her cholesterol is borderline elevated.  Review of Systems: Review of Systems  Constitutional: Negative for chills, fever and weight loss.  HENT: Negative for congestion, sinus pain and sore throat.   Eyes: Negative for blurred vision and pain.  Respiratory: Negative for hemoptysis, shortness of breath and wheezing.   Cardiovascular: Negative for chest pain, palpitations and leg swelling.  Gastrointestinal: Positive for heartburn.  Negative for abdominal pain, blood in stool, diarrhea, nausea and vomiting.  Genitourinary: Negative for dysuria, frequency, hematuria and urgency.       Positive forUrgency incontinence  Musculoskeletal: Positive for joint pain (knees). Negative for back pain and myalgias.  Skin: Negative for itching and rash.  Neurological: Negative for dizziness, tingling and headaches.  Endo/Heme/Allergies: Negative for environmental allergies and polydipsia. Does not bruise/bleed easily.       Negative for hirsutism. Positive for hot flashes   Psychiatric/Behavioral: Negative for depression. The patient is not nervous/anxious and does not have insomnia.     Past Medical History:  Past Medical History:  Diagnosis Date  . Allergy   . Anemia   . Bilateral breast cysts   . Family history of ovarian cancer    MGM. Patient had neg HBOC testing  . Gastritis 2017  . History of abnormal cervical Pap smear 2013   AGUS  . Hypertension   . Incontinence    bladder  . Obesity 01/31/2017   BMI=44    Past Surgical History:  Past Surgical History:  Procedure Laterality Date  . BREAST CYST ASPIRATION Right 06/28/2016   done at Dr. Lemar Livings office  . BREAST CYST ASPIRATION Right 2015   mulitiple  . CHOLECYSTECTOMY  2008  . COLONOSCOPY  2017   WNL  . ENDOMETRIAL BIOPSY  12/25/2010   proliferative  . ESOPHAGOGASTRODUODENOSCOPY  2017   Dr Bluford Kaufmann  . HERNIA REPAIR  2012  . TONSILLECTOMY    . TUBAL LIGATION  2000    Obstetric History:  [id number]. History of three term vaginal deliveries  Family History:  Family History  Problem Relation Age of Onset  . Hypertension Father   . Heart disease Father   . Heart disease Sister   . Hypertension Brother   . Hypertension Brother   . Hypertension Brother   . Ovarian cancer Maternal Grandmother   . Osteoporosis Maternal Grandmother     Social History:  Social History   Social History  . Marital status: Widowed    Spouse name: N/A  . Number of children:  3  . Years of education: N/A   Occupational History  . executive assistant    Social History Main Topics  . Smoking status: Never Smoker  . Smokeless tobacco: Never Used  . Alcohol use No  . Drug use: No  . Sexual activity: Not Currently    Partners: Male    Birth control/ protection: Surgical   Other Topics Concern  . Not on file   Social History Narrative  . No narrative on file    Allergies:  No Known Allergies  Medications: Prior to Admission medications   Medication Sig Start Date End Date Taking? Authorizing Provider  amLODipine-benazepril (LOTREL) 5-20 MG capsule Take 1 capsule by mouth daily.  11/23/15  Yes Historical Provider, MD  fexofenadine (ALLEGRA) 180 MG tablet Take 180 mg by mouth daily.   Yes Historical Provider, MD  imipramine (TOFRANIL) 25 MG tablet Take 25 tablets by mouth 2 (two) times daily with a meal. 02/11/13  Yes Historical Provider, MD  metoprolol succinate (TOPROL-XL) 50 MG 24 hr tablet Take by mouth. 06/04/16  Yes Historical Provider, MD  omeprazole (PRILOSEC) 20 MG capsule  12/07/16  Yes Historical Provider, MD  PROAIR HFA 108 [phone](90 Base) MCG/ACT inhaler  12/07/16  Yes Historical Provider, MD    Physical Exam Vitals: Blood pressure 120/80, pulse 93, height 5\' 4"  (1.626 m), weight 257 lb (116.6 kg), last menstrual period 01/22/2014.  General: NAD HEENT: normocephalic, anicteric Thyroid: no enlargement, no palpable nodules Pulmonary: No increased work of breathing, CTAB Cardiovascular: RRR without masses Breast: Breast symmetrical, no tenderness, no palpable nodules or masses, no skin or nipple retraction present, no nipple discharge.  No axillary or supra/infraclavicular lymphadenopathy. Abdomen: soft, non-tender, non-distended, obese.  Umbilicus without lesions.  No hepatomegaly, or masses palpable.  Genitourinary:  External: Normal external female genitalia.  Normal urethral meatus, normal Bartholin's and Skene's glands.    Vagina: Normal vaginal  mucosa, no evidence of prolapse.    Cervix: Grossly normal in appearance, no bleeding  Uterus: AV, Non-enlarged, mobile, normal contour, NT  Adnexa: ovaries non-enlarged, no adnexal masses  Rectal: deferred  Lymphatic: no evidence of inguinal lymphadenopathy Extremities: no edema, erythema, or tenderness Neurologic: Grossly intact Psychiatric: mood appropriate, affect full      Assessment: 51 y.o. [id number] well woman exam  Plan:   1) Mammogram - completed 01/31/17 3D at [place]. - recommend yearly screening mammogram  2) Pap smear done  3) Osteoporosis prevention - exercise encouraged. Getting adequate calcium in diet. Recent vitamin D levels WNL per pt  4) Routine healthcare maintenance including cholesterol, diabetes screening per PCP. Encouraged her weight loss  5) Colonoscopy - done in 2017, normal. Also had endoscopy at the same time, normal. Repeat in 10 years.  6) Follow up 1 year for routine annual  Farrel Connersolleen Delpha Perko, [place]

## 2017-02-04 LAB — IGP, APTIMA HPV
HPV Aptima: NEGATIVE
PAP Smear Comment: 0

## 2017-02-28 ENCOUNTER — Encounter: Payer: Self-pay | Admitting: Obstetrics and Gynecology

## 2017-09-28 DIAGNOSIS — K529 Noninfective gastroenteritis and colitis, unspecified: Secondary | ICD-10-CM | POA: Insufficient documentation

## 2017-09-28 DIAGNOSIS — K921 Melena: Secondary | ICD-10-CM | POA: Insufficient documentation

## 2017-09-28 DIAGNOSIS — M1712 Unilateral primary osteoarthritis, left knee: Secondary | ICD-10-CM | POA: Insufficient documentation

## 2017-09-28 DIAGNOSIS — E785 Hyperlipidemia, unspecified: Secondary | ICD-10-CM | POA: Insufficient documentation

## 2017-12-23 ENCOUNTER — Encounter: Payer: Self-pay | Admitting: *Deleted

## 2017-12-24 ENCOUNTER — Ambulatory Visit: Payer: BC Managed Care – PPO | Admitting: Certified Registered Nurse Anesthetist

## 2017-12-24 ENCOUNTER — Encounter
Admission: RE | Disposition: A | Discharge status: Home / Self Care | Payer: Self-pay | Source: Ambulatory Visit | Attending: Internal Medicine

## 2017-12-24 ENCOUNTER — Encounter: Payer: Self-pay | Admitting: *Deleted

## 2017-12-24 ENCOUNTER — Ambulatory Visit
Admission: RE | Admit: 2017-12-24 | Discharge: 2017-12-24 | Disposition: A | Discharge status: Home / Self Care | Payer: BC Managed Care – PPO | Source: Ambulatory Visit | Attending: Internal Medicine | Admitting: Internal Medicine

## 2017-12-24 ENCOUNTER — Other Ambulatory Visit: Payer: Self-pay

## 2017-12-24 DIAGNOSIS — Z888 Allergy status to other drugs, medicaments and biological substances status: Secondary | ICD-10-CM | POA: Diagnosis not present

## 2017-12-24 DIAGNOSIS — I10 Essential (primary) hypertension: Secondary | ICD-10-CM | POA: Diagnosis not present

## 2017-12-24 DIAGNOSIS — K640 First degree hemorrhoids: Secondary | ICD-10-CM | POA: Insufficient documentation

## 2017-12-24 DIAGNOSIS — R1032 Left lower quadrant pain: Secondary | ICD-10-CM | POA: Diagnosis present

## 2017-12-24 DIAGNOSIS — K219 Gastro-esophageal reflux disease without esophagitis: Secondary | ICD-10-CM | POA: Insufficient documentation

## 2017-12-24 DIAGNOSIS — F419 Anxiety disorder, unspecified: Secondary | ICD-10-CM | POA: Insufficient documentation

## 2017-12-24 DIAGNOSIS — E669 Obesity, unspecified: Secondary | ICD-10-CM | POA: Insufficient documentation

## 2017-12-24 DIAGNOSIS — Z79899 Other long term (current) drug therapy: Secondary | ICD-10-CM | POA: Insufficient documentation

## 2017-12-24 DIAGNOSIS — Z6841 Body Mass Index (BMI) 40.0 and over, adult: Secondary | ICD-10-CM | POA: Insufficient documentation

## 2017-12-24 HISTORY — DX: Chlamydial infection, unspecified: A74.9

## 2017-12-24 HISTORY — DX: Other specified bacterial intestinal infections: A04.8

## 2017-12-24 HISTORY — DX: Gastro-esophageal reflux disease without esophagitis: K21.9

## 2017-12-24 HISTORY — DX: Unspecified osteoarthritis, unspecified site: M19.90

## 2017-12-24 HISTORY — DX: Headache: R51

## 2017-12-24 HISTORY — DX: Headache, unspecified: R51.9

## 2017-12-24 HISTORY — PX: COLONOSCOPY WITH PROPOFOL: SHX5780

## 2017-12-24 HISTORY — DX: Anxiety disorder, unspecified: F41.9

## 2017-12-24 HISTORY — DX: Unspecified asthma, uncomplicated: J45.909

## 2017-12-24 HISTORY — DX: Gonococcal infection, unspecified: A54.9

## 2017-12-24 LAB — POCT PREGNANCY, URINE: Preg Test, Ur: NEGATIVE

## 2017-12-24 SURGERY — COLONOSCOPY WITH PROPOFOL
Anesthesia: General

## 2017-12-24 MED ORDER — LIDOCAINE HCL (PF) 2 % IJ SOLN
INTRAMUSCULAR | Status: AC
  Filled 2017-12-24: qty 10

## 2017-12-24 MED ORDER — PROPOFOL 500 MG/50ML IV EMUL
INTRAVENOUS | Status: DC | PRN
  Administered 2017-12-24: 140 ug/kg/min via INTRAVENOUS

## 2017-12-24 MED ORDER — SODIUM CHLORIDE 0.9 % IV SOLN
INTRAVENOUS | Status: DC
  Administered 2017-12-24: 13:00:00 via INTRAVENOUS

## 2017-12-24 MED ORDER — PROPOFOL 10 MG/ML IV BOLUS
INTRAVENOUS | Status: DC | PRN
  Administered 2017-12-24: 90 mg via INTRAVENOUS

## 2017-12-24 MED ORDER — PROPOFOL 500 MG/50ML IV EMUL
INTRAVENOUS | Status: AC
  Filled 2017-12-24: qty 50

## 2017-12-24 MED ORDER — LIDOCAINE HCL (CARDIAC) 20 MG/ML IV SOLN
INTRAVENOUS | Status: DC | PRN
  Administered 2017-12-24: 50 mg via INTRATRACHEAL

## 2017-12-24 NOTE — Anesthesia Post-op Follow-up Note (Signed)
Anesthesia QCDR form completed.        

## 2017-12-24 NOTE — Interval H&P Note (Signed)
History and Physical Interval Note:  12/24/2017 1:52 PM  Anita Clark  has presented today for surgery, with the diagnosis of LUQ Pain;  Diarrhea  The various methods of treatment have been discussed with the patient and family. After consideration of risks, benefits and other options for treatment, the patient has consented to  Procedure(s): COLONOSCOPY WITH PROPOFOL (N/A) as a surgical intervention .  The patient's history has been reviewed, patient examined, no change in status, stable for surgery.  I have reviewed the patient's chart and labs.  Questions were answered to the patient's satisfaction.     McGregoroledo, [place]

## 2017-12-24 NOTE — Transfer of Care (Signed)
Immediate Anesthesia Transfer of Care Note  Patient: Anita Clark  Procedure(s) Performed: COLONOSCOPY WITH PROPOFOL (N/A )  Patient Location: PACU and Endoscopy Unit  Anesthesia Type:General  Level of Consciousness: awake, alert , oriented and patient cooperative  Airway & Oxygen Therapy: Patient Spontanous Breathing and Patient connected to nasal cannula oxygen  Post-op Assessment: Report given to RN and Post -op Vital signs reviewed and stable  Post vital signs: Reviewed and stable  Last Vitals:  Vitals:   12/24/17 1300 12/24/17 1425  BP: 131/85   Pulse: 94 (!) (P) 101  Resp: 16 (P) 16  Temp: 36.8 C (!) (P) 36.2 C  SpO2: 99% (P) 99%    Last Pain:  Vitals:   12/24/17 1425  TempSrc: (P) Tympanic         Complications: No apparent anesthesia complications

## 2017-12-24 NOTE — H&P (Signed)
Outpatient short stay form Pre-procedure 12/24/2017 1:47 PM Kristin Barcus K. Alice Reichert, M.D.  Primary Physician: Marilynne Halsted, NP  Reason for visit:  Bloody diarrhea, LLQ pain  History of present illness:  Pt is a 52 y/o female presenting for abdominal pain, LLQ associated with bloody diarrhea. Patient's intermittent crampy left lower quadrant pain without any weight loss. Recent labs revealed an elevated C-reactive protein 5.6. Fecal CalProtectin was also elevated.   No current facility-administered medications for this encounter.   Medications Prior to Admission  Medication Sig Dispense Refill Last Dose  . amLODipine-benazepril (LOTREL) 5-20 MG capsule Take 1 capsule by mouth daily.    12/24/2017 at Unknown time  . metoprolol succinate (TOPROL-XL) 50 MG 24 hr tablet Take by mouth.   12/24/2017 at Unknown time  . omeprazole (PRILOSEC) 20 MG capsule    12/24/2017 at Unknown time  . fexofenadine (ALLEGRA) 180 MG tablet Take 180 mg by mouth daily.   12/22/2017  . imipramine (TOFRANIL) 25 MG tablet Take 25 tablets by mouth 2 (two) times daily with a meal.   12/22/2017  . PROAIR HFA 108 (90 Base) MCG/ACT inhaler    Not Taking at Unknown time     Allergies  Allergen Reactions  . Hydrochlorothiazide   . Saxenda [Liraglutide -Weight Management]      Past Medical History:  Diagnosis Date  . Allergy   . Anemia   . Anxiety   . Arthritis   . Asthma   . Bilateral breast cysts   . Chlamydia   . Family history of ovarian cancer    MGM. Patient had neg BRCA testing (before 2014). Qualifies for update testing.  . Gastritis 2017  . GERD (gastroesophageal reflux disease)   . Gonorrhea   . H. pylori infection   . Headache    migraines  . History of abnormal cervical Pap smear 2013   AGUS  . Hypertension   . Incontinence    bladder  . Obesity 01/31/2017   BMI=44  . Obesity     Review of systems:      Physical Exam  General appearance: alert, cooperative and appears stated  age Resp: clear to auscultation bilaterally Cardio: regular rate and rhythm, S1, S2 normal, no murmur, click, rub or gallop GI: soft, non-tender; bowel sounds normal; no masses,  no organomegaly Extremities: extremities normal, atraumatic, no cyanosis or edema     Planned procedures: Proceed with colonoscopy. The patient understands the nature of the planned procedure, indications, risks, alternatives and potential complications including but not limited to bleeding, infection, perforation, damage to internal organs and possible oversedation/side effects from anesthesia. The patient agrees and gives consent to proceed.  Please refer to procedure notes for findings, recommendations and patient disposition/instructions.    Jeffie Widdowson K. Alice Reichert, M.D. Gastroenterology 12/24/2017  1:47 PM

## 2017-12-24 NOTE — Op Note (Signed)
[facility] Gastroenterology Patient Name: Anita Clark Procedure Date: 12/24/2017 1:40 PM MRN: [id number] Account #: [id number] Date of Birth: [birth year] Admit Type: Outpatient Age: 52 Room: [facility] ENDO ROOM 1 Gender: Female Note Status: Finalized Procedure:            Colonoscopy Indications:          Abdominal pain in the left lower quadrant, Diarrhea,                        Hematochezia Providers:            Boykin Nearing. Norma Fredrickson MD, MD Referring MD:         Caryl Asp (Referring MD) Medicines:            Propofol per Anesthesia Complications:        No immediate complications. Procedure:            Pre-Anesthesia Assessment:                       - The risks and benefits of the procedure and the                        sedation options and risks were discussed with the                        patient. All questions were answered and informed                        consent was obtained.                       - Patient identification and proposed procedure were                        verified prior to the procedure by the nurse. The                        procedure was verified in the procedure room.                       - ASA Grade Assessment: II - A patient with mild                        systemic disease.                       - After reviewing the risks and benefits, the patient                        was deemed in satisfactory condition to undergo the                        procedure.                       After obtaining informed consent, the colonoscope was                        passed under direct vision. Throughout the procedure,  the patient's blood pressure, pulse, and oxygen                        saturations were monitored continuously. The                        Colonoscope was introduced through the anus and                        advanced to the the terminal ileum, with identification                        of the  appendiceal orifice and IC valve. The                        colonoscopy was performed without difficulty. The                        patient tolerated the procedure well. The quality of                        the bowel preparation was good. The terminal ileum,                        ileocecal valve, appendiceal orifice, and rectum were                        photographed. Findings:      The perianal and digital rectal examinations were normal. Pertinent       negatives include normal sphincter tone and no palpable rectal lesions.      The terminal ileum appeared normal. Biopsies were taken with a cold       forceps for histology.      The colon (entire examined portion) appeared normal. Biopsies were       obtained in the entire colon and in the terminal ileum with cold forceps       for histology.      Non-bleeding internal hemorrhoids were found during retroflexion. The       hemorrhoids were Grade I (internal hemorrhoids that do not prolapse). Impression:           - The examined portion of the ileum was normal.                        Biopsied.                       - The entire examined colon is normal.                       - Non-bleeding internal hemorrhoids.                       - Biopsies were obtained in the entire colon and in the                        terminal ileum. Recommendation:       - Await pathology results.                       - Patient has a contact number available for  emergencies. The signs and symptoms of potential                        delayed complications were discussed with the patient.                        Return to normal activities tomorrow. Written discharge                        instructions were provided to the patient.                       - Resume previous diet.                       - Continue present medications.                       - Repeat colonoscopy in 10 years for screening purposes.                       -  Return to GI office PRN.                       - The findings and recommendations were discussed with                        the patient and their family. Procedure Code(s):    --- Professional ---                       [id number], Colonoscopy, flexible; with biopsy, single or                        multiple Diagnosis Code(s):    --- Professional ---                       K64.0, First degree hemorrhoids                       R10.32, Left lower quadrant pain                       R19.7, Diarrhea, unspecified                       K92.1, Melena (includes Hematochezia) CPT copyright 2016 American Medical Association. All rights reserved. The codes documented in this report are preliminary and upon coder review may  be revised to meet current compliance requirements. Stanton Kidneyeodoro K Valynn Schamberger MD, MD 12/24/2017 2:26:31 PM This report has been signed electronically. Number of Addenda: 0 Note Initiated On: 12/24/2017 1:40 PM Scope Withdrawal Time: 0 hours 14 minutes 34 seconds  Total Procedure Duration: 0 hours 19 minutes 8 seconds       [facility]

## 2017-12-24 NOTE — Anesthesia Postprocedure Evaluation (Signed)
Anesthesia Post Note  Patient: Anita Clark  Procedure(s) Performed: COLONOSCOPY WITH PROPOFOL (N/A )  Patient location during evaluation: Endoscopy Anesthesia Type: General Level of consciousness: awake and alert Pain management: pain level controlled Vital Signs Assessment: post-procedure vital signs reviewed and stable Respiratory status: spontaneous breathing, nonlabored ventilation, respiratory function stable and patient connected to nasal cannula oxygen Cardiovascular status: blood pressure returned to baseline and stable Postop Assessment: no apparent nausea or vomiting Anesthetic complications: no     Last Vitals:  Vitals:   12/24/17 1435 12/24/17 1455  BP: 105/76   Pulse:    Resp:  16  Temp:    SpO2:      Last Pain:  Vitals:   12/24/17 1425  TempSrc: Tympanic                 [name]

## 2017-12-24 NOTE — Anesthesia Preprocedure Evaluation (Signed)
Anesthesia Evaluation  Patient identified by MRN, date of birth, ID band Patient awake    Reviewed: Allergy & Precautions, H&P , NPO status , Patient's Chart, lab work & pertinent test results  History of Anesthesia Complications (+) PROLONGED EMERGENCE and history of anesthetic complications  Airway Mallampati: III  TM Distance: <3 FB Neck ROM: full    Dental  (+) Chipped   Pulmonary asthma ,           Cardiovascular Exercise Tolerance: Good hypertension, (-) angina(-) Past MI and (-) DOE      Neuro/Psych  Headaches, Anxiety negative psych ROS   GI/Hepatic Neg liver ROS, GERD  Medicated and Controlled,  Endo/Other  negative endocrine ROS  Renal/GU negative Renal ROS  negative genitourinary   Musculoskeletal  (+) Arthritis ,   Abdominal   Peds  Hematology negative hematology ROS (+)   Anesthesia Other Findings Signs and symptoms suggestive of sleep apnea   Past Medical History: No date: Allergy No date: Anemia No date: Anxiety No date: Arthritis No date: Asthma No date: Bilateral breast cysts No date: Chlamydia No date: Family history of ovarian cancer     Comment:  MGM. Patient had neg BRCA testing (before 2014).               Qualifies for update testing. 2017: Gastritis No date: GERD (gastroesophageal reflux disease) No date: Gonorrhea No date: H. pylori infection No date: Headache     Comment:  migraines 2013: History of abnormal cervical Pap smear     Comment:  AGUS No date: Hypertension No date: Incontinence     Comment:  bladder 01/31/2017: Obesity     Comment:  BMI=44 No date: Obesity  Past Surgical History: 06/28/2016: BREAST CYST ASPIRATION; Right     Comment:  done at Dr. Bary Castilla office 2015: BREAST CYST ASPIRATION; Right     Comment:  mulitiple 2008: CHOLECYSTECTOMY 2017: COLONOSCOPY     Comment:  WNL 12/25/2010: ENDOMETRIAL BIOPSY     Comment:  proliferative 2017:  ESOPHAGOGASTRODUODENOSCOPY     Comment:  Dr Candace Cruise 2012: HERNIA REPAIR No date: TONSILLECTOMY 2000: TUBAL LIGATION  BMI    Body Mass Index:  43.60 kg/m      Reproductive/Obstetrics negative OB ROS                             Anesthesia Physical Anesthesia Plan  ASA: III  Anesthesia Plan: General   Post-op Pain Management:    Induction: Intravenous  PONV Risk Score and Plan: Propofol infusion and TIVA  Airway Management Planned: Natural Airway and Nasal Cannula  Additional Equipment:   Intra-op Plan:   Post-operative Plan:   Informed Consent: I have reviewed the patients History and Physical, chart, labs and discussed the procedure including the risks, benefits and alternatives for the proposed anesthesia with the patient or authorized representative who has indicated his/her understanding and acceptance.   Dental Advisory Given  Plan Discussed with: Anesthesiologist, CRNA and Surgeon  Anesthesia Plan Comments: (Patient consented for risks of anesthesia including but not limited to:  - adverse reactions to medications - risk of intubation if required - damage to teeth, lips or other oral mucosa - sore throat or hoarseness - Damage to heart, brain, lungs or loss of life  Patient voiced understanding.)        Anesthesia Quick Evaluation

## 2017-12-25 ENCOUNTER — Encounter: Payer: Self-pay | Admitting: Internal Medicine

## 2017-12-26 LAB — SURGICAL PATHOLOGY

## 2018-03-03 ENCOUNTER — Other Ambulatory Visit: Payer: Self-pay | Admitting: Certified Nurse Midwife

## 2018-03-03 DIAGNOSIS — Z1231 Encounter for screening mammogram for malignant neoplasm of breast: Secondary | ICD-10-CM

## 2018-03-18 ENCOUNTER — Ambulatory Visit
Admission: RE | Admit: 2018-03-18 | Discharge: 2018-03-18 | Disposition: A | Discharge status: Home / Self Care | Payer: BC Managed Care – PPO | Source: Ambulatory Visit | Attending: Certified Nurse Midwife | Admitting: Certified Nurse Midwife

## 2018-03-18 DIAGNOSIS — Z1231 Encounter for screening mammogram for malignant neoplasm of breast: Secondary | ICD-10-CM | POA: Insufficient documentation

## 2018-11-12 DIAGNOSIS — J339 Nasal polyp, unspecified: Secondary | ICD-10-CM

## 2018-11-12 HISTORY — DX: Nasal polyp, unspecified: J33.9

## 2018-12-23 DIAGNOSIS — J3489 Other specified disorders of nose and nasal sinuses: Secondary | ICD-10-CM | POA: Insufficient documentation

## 2018-12-25 ENCOUNTER — Other Ambulatory Visit: Payer: Self-pay | Admitting: Certified Nurse Midwife

## 2018-12-25 DIAGNOSIS — Z1231 Encounter for screening mammogram for malignant neoplasm of breast: Secondary | ICD-10-CM

## 2019-01-14 NOTE — Progress Notes (Signed)
Gynecology Annual Exam  PCP: Sallee Lange, NP  Chief Complaint:  Chief Complaint  Patient presents with  . Annual Exam   Note: At the time of her appointment, the computers were down and there was limited history available.  History of Present Illness: Patient is a 53 y.o. [id number] postmenopausal female who presents for annual exam. The patient has no significant gyn complaints today.  She has occasional hot flashes.  She is taking imiprimine for urgency incontinence and is followed by urology. Usually can wear only 1 pad a day, but if she is not taking the imipramine the urgency incontinence is worse  LMP: Patient's last menstrual period was 01/22/2014.  Since her last annual exam 01/31/2017 she has had a colonoscopy (2019) for bloody diarrhea which was normal. She was treated for colitis. 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 She is taking doxycycline and has just finished a course of Prednisone. She has also lost 30# on Weight Watchers and her current BMI is 38.77kg/m2. She also has a new granddaughter named Layla who is 19 mos old. Her past medical history is remarkable for arthritis of her knees, anxiety, asthma, gastritis, GERD, hypertension, obesity and urgency incontinence.  Her last pap smear was 01/31/2017 was NIL/negative HRHPV. Has a hx of AGUS PAp back in 2013 There is a family history of ovarian cancer in her MGM. The patient tested negative for HBOC. There is no family history of breast cancer. She does monthly self breast exams. Her last mammogram was 03/18/2018 and results are benign. Hx of an aspiration of a breast cyst in August 2017 by Dr Bary Castilla She does not drink alcohol or use tobacco products She does get adequate calcium in her diet.  She has not been exercising due to knee pain, but is looking into water aerobics classes. She recently had a lipid panel by  PCP in 2019 and her cholesterol is borderline elevated.  Review of Systems: Review of Systems  Constitutional: Negative for chills, fever and weight loss.  HENT: Positive for congestion, nosebleeds and sinus pain. Negative for sore throat.        Pain in right nostril  Eyes: Negative for blurred vision and pain.  Respiratory: Negative for hemoptysis, shortness of breath and wheezing.   Cardiovascular: Negative for chest pain, palpitations and leg swelling.  Gastrointestinal: Positive for heartburn. Negative for abdominal pain, blood in stool, diarrhea, nausea and vomiting.  Genitourinary: Negative for dysuria, frequency, hematuria and urgency.       Positive forUrgency incontinence  Musculoskeletal: Positive for joint pain (knees). Negative for back pain and myalgias.  Skin: Negative for itching and rash.  Neurological: Negative for dizziness, tingling and headaches.  Endo/Heme/Allergies: Negative for environmental allergies and polydipsia. Does not bruise/bleed easily.       Negative for hirsutism. Positive for hot flashes   Psychiatric/Behavioral: Negative for depression. The patient is not nervous/anxious and does not have insomnia.     Past Medical History:  Past Medical History:  Diagnosis Date  . Allergy   . Anemia   . Anxiety   . Arthritis   . Asthma   . Bilateral breast cysts   . Chlamydia   . Family history of ovarian cancer    MGM. Patient had neg BRCA testing (before 2014). Qualifies for update testing.  . Gastritis 2017  . GERD (gastroesophageal reflux disease)   .  Gonorrhea   . H. pylori infection   . Headache    migraines  . History of abnormal cervical Pap smear 2013   AGUS  . Hypertension   . Incontinence    bladder  . Nasal polyp 2020  . Obesity 01/31/2017   BMI=44  . Obesity     Past Surgical History:  Past Surgical History:  Procedure Laterality Date  . BREAST CYST ASPIRATION Right 06/28/2016   done at Dr. Bary Castilla office  . BREAST CYST  ASPIRATION Right 2015   mulitiple  . CHOLECYSTECTOMY  2008  . COLONOSCOPY  2017   WNL  . COLONOSCOPY WITH PROPOFOL N/A 12/24/2017   Procedure: COLONOSCOPY WITH PROPOFOL;  Surgeon: Toledo, Benay Pike, MD;  Location: [facility] ENDOSCOPY;  Service: Gastroenterology;  Laterality: N/A;  . ENDOMETRIAL BIOPSY  12/25/2010   proliferative  . ESOPHAGOGASTRODUODENOSCOPY  2017   Dr Candace Cruise  . HERNIA REPAIR  2012  . TONSILLECTOMY    . TUBAL LIGATION  2000    Obstetric History: [id number]. History of three term vaginal deliveries  Family History:  Family History  Problem Relation Age of Onset  . Hypertension Father   . Heart disease Father   . Heart disease Sister   . Hypertension Brother   . Hypertension Brother   . Hypertension Brother   . Ovarian cancer Maternal Grandmother 86  . Osteoporosis Maternal Grandmother     Social History:  Social History   Socioeconomic History  . Marital status: Widowed    Spouse name: Not on file  . Number of children: 3  . Years of education: Not on file  . Highest education level: Not on file  Occupational History  . Occupation: Scientist, water quality  Social Needs  . Financial resource strain: Not on file  . Food insecurity:    Worry: Not on file    Inability: Not on file  . Transportation needs:    Medical: Not on file    Non-medical: Not on file  Tobacco Use  . Smoking status: Never Smoker  . Smokeless tobacco: Never Used  Substance and Sexual Activity  . Alcohol use: No  . Drug use: No  . Sexual activity: Not Currently    Partners: Male    Birth control/protection: Surgical  Lifestyle  . Physical activity:    Days per week: Not on file    Minutes per session: Not on file  . Stress: Not on file  Relationships  . Social connections:    Talks on phone: Not on file    Gets together: Not on file    Attends religious service: Not on file    Active member of club or organization: Not on file    Attends meetings of clubs or organizations: Not on  file    Relationship status: Not on file  . Intimate partner violence:    Fear of current or ex partner: Not on file    Emotionally abused: Not on file    Physically abused: Not on file    Forced sexual activity: Not on file  Other Topics Concern  . Not on file  Social History Narrative  . Not on file    Allergies:  Allergies  Allergen Reactions  . Hydrochlorothiazide   . Saxenda [Liraglutide -Weight Management]     Medications: Current Outpatient Medications on File Prior to Visit  Medication Sig Dispense Refill  . amLODipine-benazepril (LOTREL) 5-20 MG capsule Take 1 capsule by mouth daily.     Marland Kitchen doxycycline (VIBRAMYCIN)  100 MG capsule TAKE 1 CAPSULE BY MOUTH TWICE DAILY FOR 21 DAYS    . EPINEPHrine 0.3 mg/0.3 mL IJ SOAJ injection Inject into the muscle.    . fexofenadine (ALLEGRA) 180 MG tablet Take 180 mg by mouth daily.    Marland Kitchen imipramine (TOFRANIL) 25 MG tablet Take 25 tablets by mouth 2 (two) times daily with a meal.    . meloxicam (MOBIC) 7.5 MG tablet     . metoprolol succinate (TOPROL-XL) 50 MG 24 hr tablet Take by mouth.    Marland Kitchen omeprazole (PRILOSEC) 40 MG capsule     . PROAIR HFA 108 (90 Base) MCG/ACT inhaler     . vitamin B-12 (CYANOCOBALAMIN) 1000 MCG tablet Take 1,000 mcg by mouth daily.     No current facility-administered medications on file prior to visit.    Physical Exam Vitals: BP 122/68   Ht 5' 4"  (1.626 m)   Wt 226 lb (102.5 kg)   LMP 01/22/2014 Comment: pregnancy test done   BMI 38.79 kg/m   General: NAD HEENT: normocephalic, anicteric Thyroid: no enlargement, no palpable nodules Pulmonary: No increased work of breathing, CTAB Cardiovascular: RRR without masses Breast: Breast symmetrical, no tenderness, no palpable nodules or masses, no skin or nipple retraction present, no nipple discharge.  No axillary or supra/infraclavicular lymphadenopathy. Abdomen: soft, non-tender, non-distended, obese.  Umbilicus without lesions.  No hepatomegaly, or masses  palpable.  Genitourinary:  External: Normal external female genitalia.  Normal urethral meatus, normal Bartholin's and Skene's glands.    Vagina: Normal vaginal mucosa, no evidence of prolapse.    Cervix: Grossly normal in appearance, no bleeding  Uterus: AV, Non-enlarged, mobile, normal contour, NT  Adnexa: ovaries non-enlarged, no adnexal masses  Rectal: deferred  Lymphatic: no evidence of inguinal lymphadenopathy Extremities: no edema, erythema, or tenderness Neurologic: Grossly intact Psychiatric: mood appropriate, affect full      Assessment: 53 y.o. [id number] well woman exam  Plan:   1) Mammogram -  - recommend yearly screening mammogram. Next due after 03/19/2019  2) Pap smear done  3) Osteoporosis prevention - exercise encouraged. Discussed calcium and vitamin D3 requirements  4) Routine healthcare maintenance including cholesterol, diabetes screening per PCP.   5) Colonoscopy - done in 2019, normal.  Repeat in 10 years.  6) Follow up 1 year for routine annual  Dalia Heading, [place]

## 2019-01-15 ENCOUNTER — Encounter: Payer: Self-pay | Admitting: Certified Nurse Midwife

## 2019-01-15 ENCOUNTER — Ambulatory Visit (INDEPENDENT_AMBULATORY_CARE_PROVIDER_SITE_OTHER): Payer: BC Managed Care – PPO | Admitting: Certified Nurse Midwife

## 2019-01-15 ENCOUNTER — Other Ambulatory Visit (HOSPITAL_COMMUNITY)
Admission: RE | Admit: 2019-01-15 | Discharge: 2019-01-15 | Disposition: A | Discharge status: Home / Self Care | Payer: BC Managed Care – PPO | Source: Ambulatory Visit | Attending: Certified Nurse Midwife | Admitting: Certified Nurse Midwife

## 2019-01-15 VITALS — BP 122/68 | Ht 64.0 in | Wt 226.0 lb

## 2019-01-15 DIAGNOSIS — Z01419 Encounter for gynecological examination (general) (routine) without abnormal findings: Secondary | ICD-10-CM | POA: Diagnosis present

## 2019-01-15 DIAGNOSIS — Z124 Encounter for screening for malignant neoplasm of cervix: Secondary | ICD-10-CM | POA: Insufficient documentation

## 2019-01-15 DIAGNOSIS — Z1239 Encounter for other screening for malignant neoplasm of breast: Secondary | ICD-10-CM

## 2019-01-19 ENCOUNTER — Encounter: Payer: Self-pay | Admitting: Certified Nurse Midwife

## 2019-01-19 LAB — CYTOLOGY - PAP: DIAGNOSIS: NEGATIVE

## 2019-04-01 ENCOUNTER — Other Ambulatory Visit: Payer: Self-pay

## 2019-04-01 ENCOUNTER — Ambulatory Visit
Admission: RE | Admit: 2019-04-01 | Discharge: 2019-04-01 | Disposition: A | Discharge status: Home / Self Care | Payer: BC Managed Care – PPO | Source: Ambulatory Visit | Attending: Certified Nurse Midwife | Admitting: Certified Nurse Midwife

## 2019-04-01 DIAGNOSIS — Z1231 Encounter for screening mammogram for malignant neoplasm of breast: Secondary | ICD-10-CM | POA: Insufficient documentation

## 2019-11-20 ENCOUNTER — Ambulatory Visit: Payer: BC Managed Care – PPO | Attending: Internal Medicine

## 2019-11-20 DIAGNOSIS — Z20822 Contact with and (suspected) exposure to covid-19: Secondary | ICD-10-CM

## 2019-11-22 LAB — NOVEL CORONAVIRUS, NAA: SARS-CoV-2, NAA: NOT DETECTED

## 2019-12-14 DIAGNOSIS — R7302 Impaired glucose tolerance (oral): Secondary | ICD-10-CM | POA: Insufficient documentation

## 2019-12-14 DIAGNOSIS — E538 Deficiency of other specified B group vitamins: Secondary | ICD-10-CM | POA: Insufficient documentation

## 2020-03-10 ENCOUNTER — Other Ambulatory Visit: Payer: Self-pay | Admitting: Certified Nurse Midwife

## 2020-03-10 ENCOUNTER — Telehealth: Payer: Self-pay

## 2020-03-10 NOTE — Telephone Encounter (Signed)
Pt needs a order put in for her mammo at [facility]

## 2020-03-12 ENCOUNTER — Other Ambulatory Visit: Payer: Self-pay | Admitting: Certified Nurse Midwife

## 2020-03-12 DIAGNOSIS — Z1231 Encounter for screening mammogram for malignant neoplasm of breast: Secondary | ICD-10-CM

## 2020-03-30 ENCOUNTER — Other Ambulatory Visit: Payer: Self-pay

## 2020-03-30 ENCOUNTER — Encounter: Payer: Self-pay | Admitting: Certified Nurse Midwife

## 2020-03-30 ENCOUNTER — Ambulatory Visit (INDEPENDENT_AMBULATORY_CARE_PROVIDER_SITE_OTHER): Payer: BC Managed Care – PPO | Admitting: Certified Nurse Midwife

## 2020-03-30 VITALS — BP 124/80 | Ht 64.0 in | Wt 248.0 lb

## 2020-03-30 DIAGNOSIS — N3281 Overactive bladder: Secondary | ICD-10-CM | POA: Insufficient documentation

## 2020-03-30 DIAGNOSIS — Z01419 Encounter for gynecological examination (general) (routine) without abnormal findings: Secondary | ICD-10-CM

## 2020-03-30 DIAGNOSIS — J309 Allergic rhinitis, unspecified: Secondary | ICD-10-CM | POA: Insufficient documentation

## 2020-03-30 DIAGNOSIS — G43909 Migraine, unspecified, not intractable, without status migrainosus: Secondary | ICD-10-CM | POA: Insufficient documentation

## 2020-03-30 NOTE — Progress Notes (Signed)
Gynecology Annual Exam  PCP: Sallee Lange, NP  Chief Complaint:  Chief Complaint  Patient presents with  . Gynecologic Exam     History of Present Illness: Anita Clark is a 54 y.o. [phone] postmenopausal female who presents for her  annual exam. The patient has no significant gyn complaints today.  Her hot flashes have become less intense and less frequent.  She denies any vaginal bleeding. She is taking imiprimine for urgency incontinence and is followed by urology.  LMP: Patient's last menstrual period was 01/22/2014.  Since her last annual exam 01/15/2019,  she had regained the 30# she lost , but she has started back with Weight Watchers and has lost 8# in the last month. 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 She also has a new granddaughter named Layla who is 89 mos old. She has had her Pfizer vaccine. Her past medical history is remarkable for arthritis of her knees, anxiety, asthma, gastritis, GERD, hypertension, obesity and urgency incontinence.  Her last pap smear was 01/15/2019 was NIL.  Has a hx of AGUS PAP back in 2013 There is a family history of ovarian cancer in her MGM. The patient tested negative for HBOC. There is no family history of breast cancer. She does monthly self breast exams. Her last mammogram was 04/01/19 and results are negative.Marland Kitchen Hx of an aspiration of a breast cyst in August 2017 by Dr Bary Castilla. Last colonoscopy 2019, next due in 10 years She rarely drinks alcohol and does not use tobacco products She does get adequate calcium in her diet.  She has not been exercising due to knee pain, but is looking into water aerobics classes. She recently had a lipid panel by PCP in 2019 and her cholesterol is borderline elevated.  Review of Systems: Review of Systems  Constitutional: Negative for chills, fever and weight loss.  HENT: Negative for congestion, nosebleeds, sinus pain  and sore throat.        Pain in right nostril  Eyes: Negative for blurred vision and pain.  Respiratory: Negative for hemoptysis, shortness of breath and wheezing.   Cardiovascular: Negative for chest pain, palpitations and leg swelling.  Gastrointestinal: Negative for abdominal pain, blood in stool, diarrhea, heartburn, nausea and vomiting.  Genitourinary: Negative for dysuria, frequency, hematuria and urgency.       Positive forUrgency incontinence  Musculoskeletal: Positive for joint pain (knees). Negative for back pain and myalgias.  Skin: Negative for itching and rash.  Neurological: Negative for dizziness, tingling and headaches.  Endo/Heme/Allergies: Positive for environmental allergies. Negative for polydipsia. Does not bruise/bleed easily.       Negative for hirsutism. Positive for hot flashes   Psychiatric/Behavioral: Negative for depression. The patient is not nervous/anxious and does not have insomnia.     Past Medical History:  Past Medical History:  Diagnosis Date  . Allergy   . Anemia   . Anxiety   . Arthritis   . Asthma   . Bilateral breast cysts   . Chlamydia   . Family history of ovarian cancer    MGM. Patient had neg BRCA testing (before 2014). Qualifies for update testing.  . Gastritis 2017  . GERD (gastroesophageal reflux disease)   . Gonorrhea   . H. pylori infection   . Headache    migraines  . History of abnormal cervical Pap smear 2013   AGUS  . Hypertension   .  Incontinence    bladder  . Nasal polyp 2020  . Obesity 01/31/2017   BMI=44  . Obesity   . Pyogenic granuloma of nares    right    Past Surgical History:  Past Surgical History:  Procedure Laterality Date  . BREAST CYST ASPIRATION Right 06/28/2016   done at Dr. Bary Castilla office  . BREAST CYST ASPIRATION Right 2015   mulitiple  . CHOLECYSTECTOMY  2008  . COLONOSCOPY  2017   WNL  . COLONOSCOPY WITH PROPOFOL N/A 12/24/2017   Procedure: COLONOSCOPY WITH PROPOFOL;  Surgeon: Toledo,  Benay Pike, MD;  Location: [facility] ENDOSCOPY;  Service: Gastroenterology;  Laterality: N/A;  . ENDOMETRIAL BIOPSY  12/25/2010   proliferative  . ESOPHAGOGASTRODUODENOSCOPY  2017   Dr Candace Cruise  . HERNIA REPAIR  2012  . NASAL POLYP SURGERY    . TONSILLECTOMY    . TUBAL LIGATION  2000    Obstetric History: [id number]. History of three term vaginal deliveries  Family History:  Family History  Problem Relation Age of Onset  . Hypertension Mother   . Hypertension Father   . Heart disease Father   . Heart disease Sister   . Hypertension Brother   . Hypertension Brother   . Hypertension Brother   . Ovarian cancer Maternal Grandmother 86  . Osteoporosis Maternal Grandmother     Social History:  Social History   Socioeconomic History  . Marital status: Widowed    Spouse name: Not on file  . Number of children: 3  . Years of education: Not on file  . Highest education level: Not on file  Occupational History  . Occupation: Scientist, water quality  Tobacco Use  . Smoking status: Never Smoker  . Smokeless tobacco: Never Used  Substance and Sexual Activity  . Alcohol use: No  . Drug use: No  . Sexual activity: Not Currently    Partners: Male    Birth control/protection: Surgical  Other Topics Concern  . Not on file  Social History Narrative   Right handed   Lives in a single story home alone with dog   Social Determinants of Health   Financial Resource Strain:   . Difficulty of Paying Living Expenses:   Food Insecurity:   . Worried About Charity fundraiser in the Last Year:   . Arboriculturist in the Last Year:   Transportation Needs:   . Film/video editor (Medical):   Marland Kitchen Lack of Transportation (Non-Medical):   Physical Activity:   . Days of Exercise per Week:   . Minutes of Exercise per Session:   Stress:   . Feeling of Stress :   Social Connections:   . Frequency of Communication with Friends and Family:   . Frequency of Social Gatherings with Friends and Family:   .  Attends Religious Services:   . Active Member of Clubs or Organizations:   . Attends Archivist Meetings:   Marland Kitchen Marital Status:   Intimate Partner Violence:   . Fear of Current or Ex-Partner:   . Emotionally Abused:   Marland Kitchen Physically Abused:   . Sexually Abused:     Allergies:  Allergies  Allergen Reactions  . Hydrochlorothiazide   . Saxenda [Liraglutide -Weight Management]     Medications: Current Outpatient Medications on File Prior to Visit  Medication Sig Dispense Refill  . amLODipine-benazepril (LOTREL) 5-20 MG capsule Take 1 capsule by mouth daily.     Marland Kitchen EPINEPHrine 0.3 mg/0.3 mL IJ SOAJ injection Inject into  the muscle.    . fexofenadine (ALLEGRA) 180 MG tablet Take 180 mg by mouth daily.    Marland Kitchen imipramine (TOFRANIL) 25 MG tablet Take 25 tablets by mouth 2 (two) times daily with a meal.    . meloxicam (MOBIC) 7.5 MG tablet     . metoprolol succinate (TOPROL-XL) 50 MG 24 hr tablet Take by mouth.    . montelukast (SINGULAIR) 10 MG tablet Take by mouth.    Marland Kitchen omeprazole (PRILOSEC) 40 MG capsule     . PROAIR HFA 108 (90 Base) MCG/ACT inhaler      No current facility-administered medications on file prior to visit.   Physical Exam Vitals: Ht 5' 4" (1.626 m)   Wt 248 lb (112.5 kg)   LMP 01/22/2014   BMI 42.57 kg/m   General: WF in NAD HEENT: normocephalic, anicteric Thyroid: no enlargement, no palpable nodules Pulmonary: No increased work of breathing, CTAB Cardiovascular: RRR without masses Breast: Breast symmetrical, no tenderness, no palpable nodules or masses, no skin or nipple retraction present, no nipple discharge.  No axillary or supra/infraclavicular lymphadenopathy. Abdomen: soft, non-tender, non-distended, obese.  Umbilicus without lesions.  No hepatomegaly, or masses palpable.  Genitourinary:  External: Normal external female genitalia.  Normal urethral meatus, normal Bartholin's and Skene's glands.    Vagina: Normal vaginal mucosa, no evidence of  prolapse.    Cervix: Grossly normal in appearance, no bleeding  Uterus: AV, Non-enlarged, mobile, normal contour, NT  Adnexa: ovaries non-enlarged, no adnexal masses  Rectal: deferred  Lymphatic: no evidence of inguinal lymphadenopathy Extremities: no edema, erythema, or tenderness Neurologic: Grossly intact Psychiatric: mood appropriate, affect full      Assessment: 54 y.o. [id number] well woman exam  Plan:   1) Mammogram -  - recommend yearly screening mammogram. Next mammogram scheduled for 04/01/2020  2) Pap smear not done. Desires Pap smears every 3 years.  3) Osteoporosis prevention - exercise encouraged. Discussed calcium and vitamin D3 requirements  4) Routine healthcare maintenance including cholesterol, diabetes screening per PCP.   5) Colonoscopy - done in 2019, normal.  Repeat in 10 years.  6) Follow up 1 year for routine annual  Dalia Heading, [place]

## 2020-04-01 ENCOUNTER — Ambulatory Visit
Admission: RE | Admit: 2020-04-01 | Discharge: 2020-04-01 | Disposition: A | Discharge status: Home / Self Care | Payer: BC Managed Care – PPO | Source: Ambulatory Visit | Attending: Certified Nurse Midwife | Admitting: Certified Nurse Midwife

## 2020-04-01 DIAGNOSIS — Z1231 Encounter for screening mammogram for malignant neoplasm of breast: Secondary | ICD-10-CM | POA: Diagnosis present

## 2020-04-29 ENCOUNTER — Encounter: Payer: Self-pay | Admitting: Obstetrics and Gynecology

## 2020-12-29 ENCOUNTER — Other Ambulatory Visit
Admission: RE | Admit: 2020-12-29 | Discharge: 2020-12-29 | Disposition: A | Discharge status: Home / Self Care | Payer: BC Managed Care – PPO | Source: Ambulatory Visit | Attending: Pulmonary Disease | Admitting: Pulmonary Disease

## 2020-12-29 DIAGNOSIS — R0602 Shortness of breath: Secondary | ICD-10-CM | POA: Insufficient documentation

## 2020-12-29 DIAGNOSIS — M7989 Other specified soft tissue disorders: Secondary | ICD-10-CM | POA: Diagnosis present

## 2020-12-29 LAB — D-DIMER, QUANTITATIVE: D-Dimer, Quant: 0.55 ug/mL-FEU — ABNORMAL HIGH (ref 0.00–0.50)

## 2021-01-17 ENCOUNTER — Other Ambulatory Visit: Payer: Self-pay | Admitting: Nurse Practitioner

## 2021-01-17 DIAGNOSIS — Z1231 Encounter for screening mammogram for malignant neoplasm of breast: Secondary | ICD-10-CM

## 2021-05-08 ENCOUNTER — Other Ambulatory Visit: Payer: Self-pay

## 2021-05-08 ENCOUNTER — Ambulatory Visit
Admission: RE | Admit: 2021-05-08 | Discharge: 2021-05-08 | Disposition: A | Discharge status: Home / Self Care | Payer: BC Managed Care – PPO | Source: Ambulatory Visit | Attending: Nurse Practitioner | Admitting: Nurse Practitioner

## 2021-05-08 DIAGNOSIS — Z1231 Encounter for screening mammogram for malignant neoplasm of breast: Secondary | ICD-10-CM | POA: Diagnosis not present

## 2022-03-26 ENCOUNTER — Other Ambulatory Visit: Payer: Self-pay | Admitting: Nurse Practitioner

## 2022-03-26 DIAGNOSIS — Z1231 Encounter for screening mammogram for malignant neoplasm of breast: Secondary | ICD-10-CM

## 2022-05-10 ENCOUNTER — Ambulatory Visit
Admission: RE | Admit: 2022-05-10 | Discharge: 2022-05-10 | Disposition: A | Discharge status: Home / Self Care | Payer: BC Managed Care – PPO | Source: Ambulatory Visit | Attending: Nurse Practitioner | Admitting: Nurse Practitioner

## 2022-05-10 DIAGNOSIS — Z1231 Encounter for screening mammogram for malignant neoplasm of breast: Secondary | ICD-10-CM | POA: Diagnosis present

## 2023-02-20 ENCOUNTER — Other Ambulatory Visit: Payer: Self-pay | Admitting: Nurse Practitioner

## 2023-02-20 DIAGNOSIS — Z1231 Encounter for screening mammogram for malignant neoplasm of breast: Secondary | ICD-10-CM

## 2023-05-14 ENCOUNTER — Ambulatory Visit
Admission: RE | Admit: 2023-05-14 | Discharge: 2023-05-14 | Disposition: A | Discharge status: Home / Self Care | Payer: BC Managed Care – PPO | Source: Ambulatory Visit | Attending: Nurse Practitioner | Admitting: Nurse Practitioner

## 2023-05-14 DIAGNOSIS — Z1231 Encounter for screening mammogram for malignant neoplasm of breast: Secondary | ICD-10-CM | POA: Insufficient documentation

## 2023-05-21 ENCOUNTER — Other Ambulatory Visit: Payer: Self-pay | Admitting: Nurse Practitioner

## 2023-05-21 DIAGNOSIS — R928 Other abnormal and inconclusive findings on diagnostic imaging of breast: Secondary | ICD-10-CM

## 2023-05-21 DIAGNOSIS — N6489 Other specified disorders of breast: Secondary | ICD-10-CM

## 2023-05-22 ENCOUNTER — Ambulatory Visit
Admission: RE | Admit: 2023-05-22 | Discharge: 2023-05-22 | Disposition: A | Discharge status: Home / Self Care | Payer: BC Managed Care – PPO | Source: Ambulatory Visit | Attending: Nurse Practitioner | Admitting: Nurse Practitioner

## 2023-05-22 DIAGNOSIS — N6489 Other specified disorders of breast: Secondary | ICD-10-CM | POA: Diagnosis present

## 2023-05-22 DIAGNOSIS — R928 Other abnormal and inconclusive findings on diagnostic imaging of breast: Secondary | ICD-10-CM

## 2023-06-11 IMAGING — MG MM DIGITAL SCREENING BILAT W/ TOMO AND CAD
6 of 10 series · 6 of 30 positions shown · non-contrast
Comparison: Previous exam(s).

CLINICAL DATA: Screening.

EXAM:
DIGITAL SCREENING BILATERAL MAMMOGRAM WITH TOMOSYNTHESIS AND CAD
TECHNIQUE: Bilateral screening digital craniocaudal and mediolateral oblique
mammograms were obtained. Bilateral screening digital breast
tomosynthesis was performed. The images were evaluated with
computer-aided detection.

[R CV synth-2D]
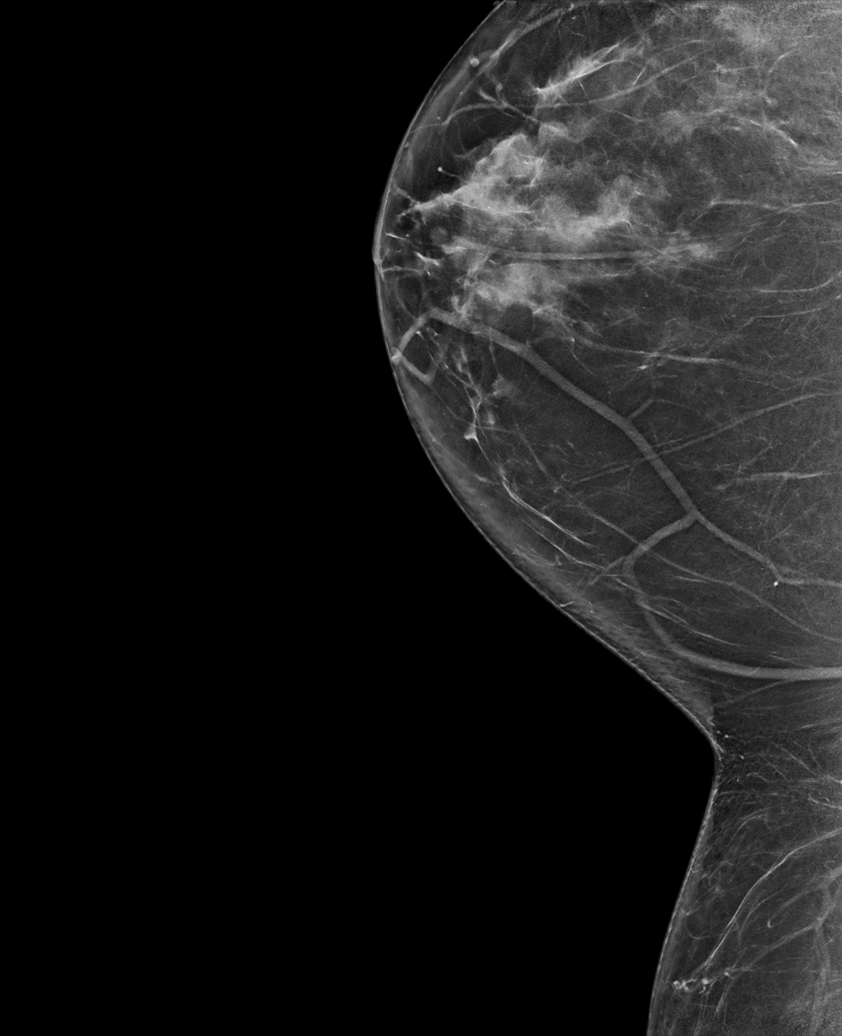

[R MLO synth-2D]
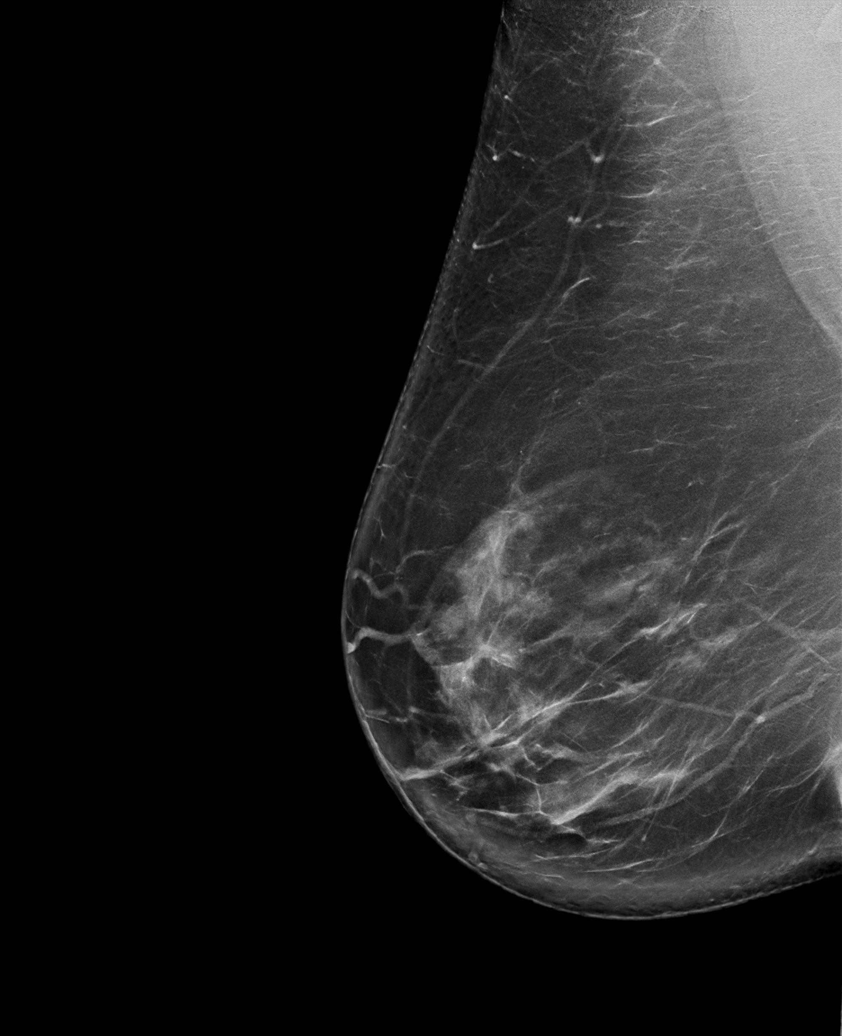

[L CC synth-2D]
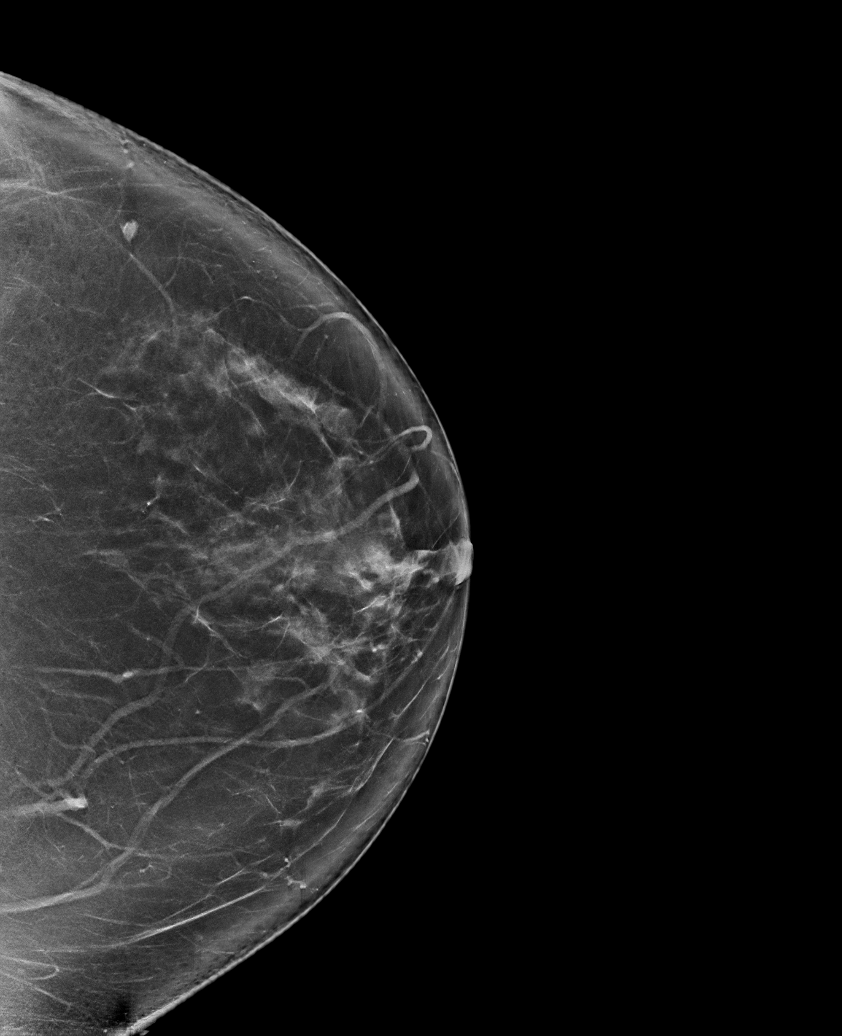

[L MLO synth-2D]
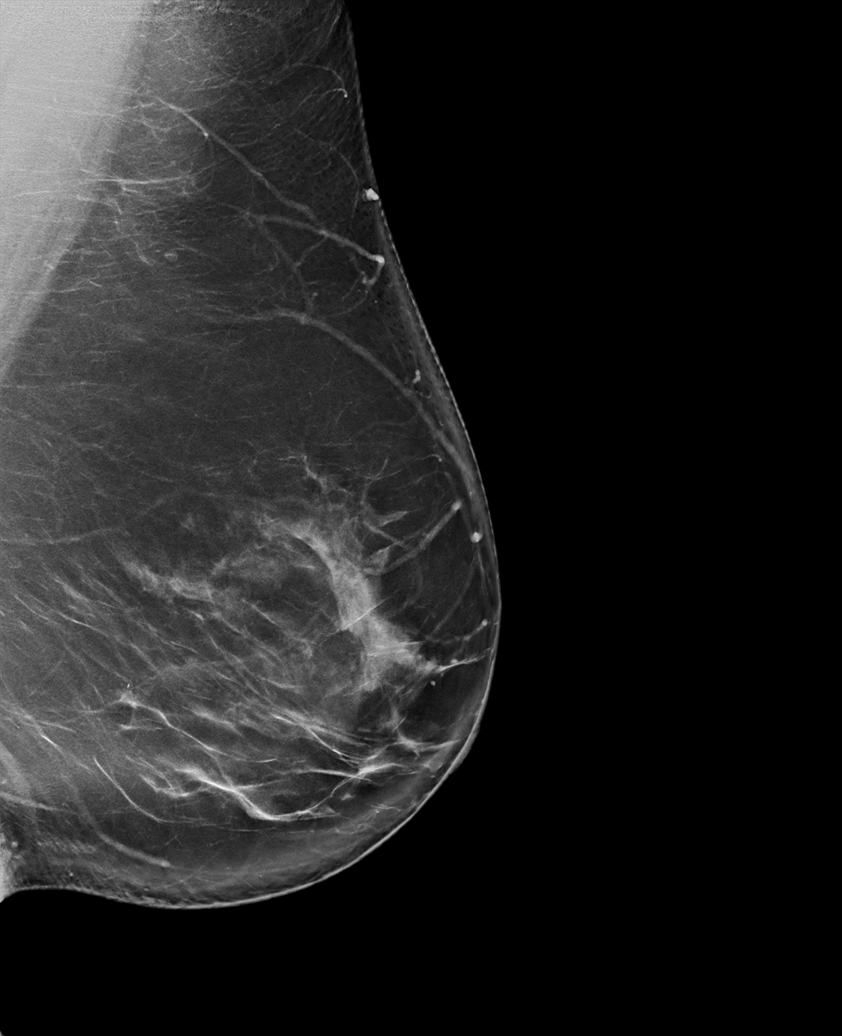

[R CC synth-2D]
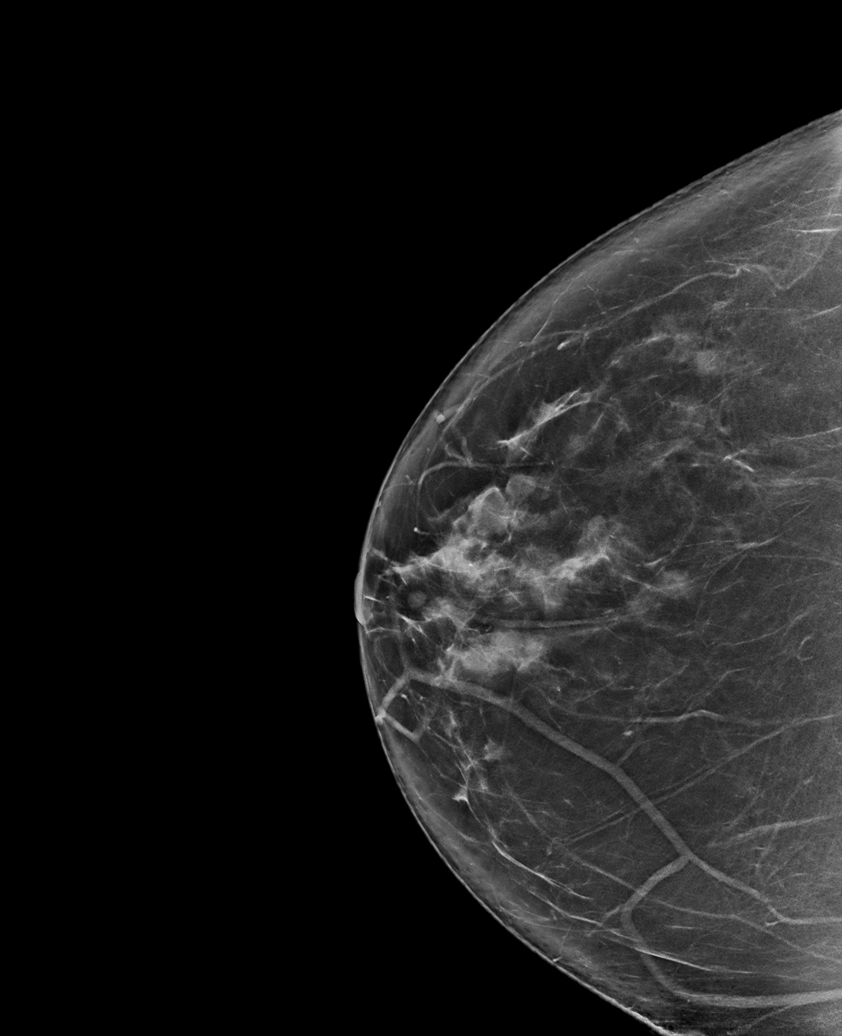

[R CC tomo · tomo slice 45/90.0]
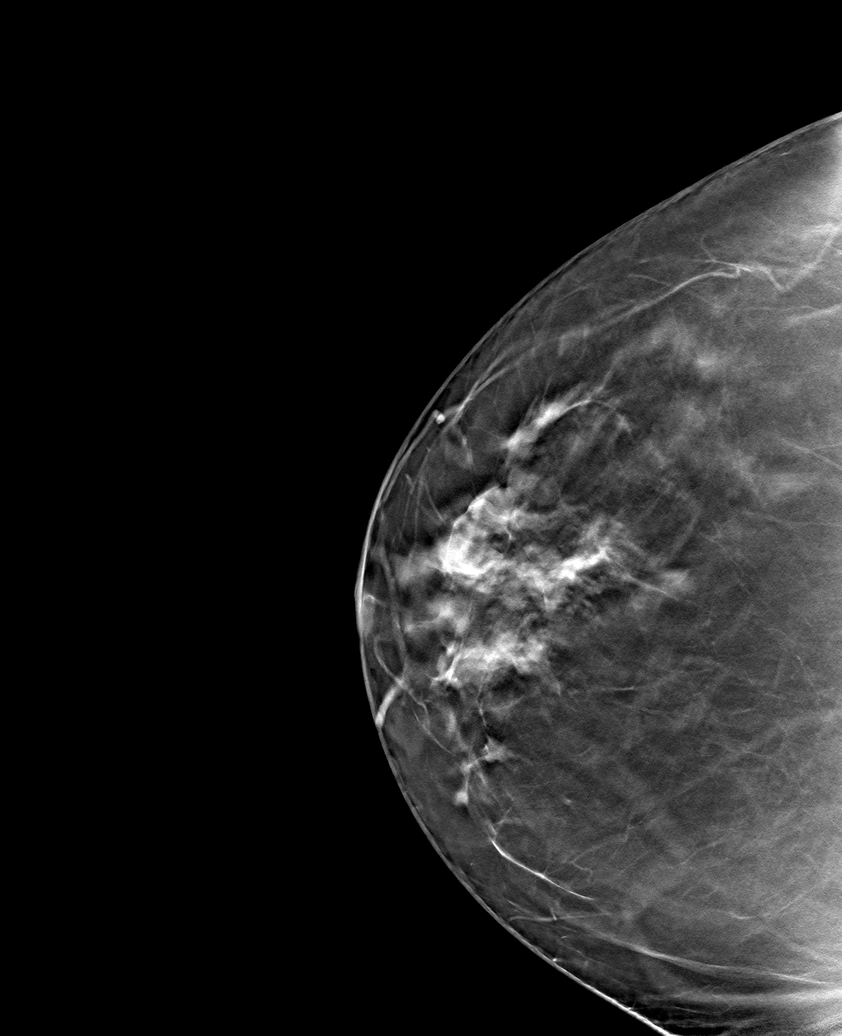

[6 of 30 positions shown; findings below may reference images not displayed]

ACR Breast Density Category b: There are scattered areas of
fibroglandular density.
FINDINGS: There are no findings suspicious for malignancy.
IMPRESSION: No mammographic evidence of malignancy. A result letter of this
screening mammogram will be mailed directly to the patient.

RECOMMENDATION:
Screening mammogram in one year. (Code:51-O-LD2)

BI-RADS CATEGORY  1: Negative.

## 2024-04-07 ENCOUNTER — Other Ambulatory Visit: Payer: Self-pay | Admitting: Nurse Practitioner

## 2024-04-07 DIAGNOSIS — Z1231 Encounter for screening mammogram for malignant neoplasm of breast: Secondary | ICD-10-CM

## 2024-06-04 ENCOUNTER — Ambulatory Visit
Admission: RE | Admit: 2024-06-04 | Discharge: 2024-06-04 | Disposition: A | Discharge status: Home / Self Care | Payer: Self-pay | Source: Ambulatory Visit | Attending: Nurse Practitioner | Admitting: Nurse Practitioner

## 2024-06-04 DIAGNOSIS — Z1231 Encounter for screening mammogram for malignant neoplasm of breast: Secondary | ICD-10-CM | POA: Diagnosis present
